# Patient Record
Sex: Male | Born: 1950 | Hispanic: Yes | Smoking: Former smoker
Health system: Southern US, Community
[De-identification: ages and names within clinical notes are randomized; demographics above are authoritative.]

## PROBLEM LIST (undated history)

## (undated) DIAGNOSIS — R519 Headache, unspecified: Secondary | ICD-10-CM

## (undated) DIAGNOSIS — I10 Essential (primary) hypertension: Secondary | ICD-10-CM

## (undated) DIAGNOSIS — F329 Major depressive disorder, single episode, unspecified: Secondary | ICD-10-CM

## (undated) DIAGNOSIS — F32A Depression, unspecified: Secondary | ICD-10-CM

## (undated) DIAGNOSIS — I2510 Atherosclerotic heart disease of native coronary artery without angina pectoris: Secondary | ICD-10-CM

## (undated) DIAGNOSIS — F419 Anxiety disorder, unspecified: Secondary | ICD-10-CM

## (undated) DIAGNOSIS — R0600 Dyspnea, unspecified: Secondary | ICD-10-CM

## (undated) DIAGNOSIS — R51 Headache: Secondary | ICD-10-CM

## (undated) DIAGNOSIS — E119 Type 2 diabetes mellitus without complications: Secondary | ICD-10-CM

## (undated) DIAGNOSIS — K219 Gastro-esophageal reflux disease without esophagitis: Secondary | ICD-10-CM

## (undated) DIAGNOSIS — M1990 Unspecified osteoarthritis, unspecified site: Secondary | ICD-10-CM

## (undated) DIAGNOSIS — J189 Pneumonia, unspecified organism: Secondary | ICD-10-CM

## (undated) DIAGNOSIS — I209 Angina pectoris, unspecified: Secondary | ICD-10-CM

## (undated) HISTORY — PX: CARDIAC CATHETERIZATION: SHX172

---

## 2015-07-28 DEATH — deceased

## 2018-04-11 DIAGNOSIS — R519 Headache, unspecified: Secondary | ICD-10-CM | POA: Insufficient documentation

## 2018-04-11 DIAGNOSIS — R42 Dizziness and giddiness: Secondary | ICD-10-CM | POA: Insufficient documentation

## 2018-04-11 DIAGNOSIS — R2681 Unsteadiness on feet: Secondary | ICD-10-CM | POA: Insufficient documentation

## 2018-04-18 DIAGNOSIS — I10 Essential (primary) hypertension: Secondary | ICD-10-CM | POA: Insufficient documentation

## 2018-11-05 ENCOUNTER — Ambulatory Visit
Admission: RE | Admit: 2018-11-05 | Discharge: 2018-11-05 | Disposition: A | Discharge status: Home / Self Care | Payer: Medicare Other | Source: Ambulatory Visit | Attending: Internal Medicine | Admitting: Internal Medicine

## 2018-11-05 ENCOUNTER — Other Ambulatory Visit: Payer: Self-pay

## 2018-11-05 ENCOUNTER — Encounter: Payer: Self-pay | Admitting: *Deleted

## 2018-11-05 ENCOUNTER — Encounter
Admission: RE | Disposition: A | Discharge status: Home / Self Care | Payer: Self-pay | Source: Ambulatory Visit | Attending: Internal Medicine

## 2018-11-05 DIAGNOSIS — I10 Essential (primary) hypertension: Secondary | ICD-10-CM | POA: Insufficient documentation

## 2018-11-05 DIAGNOSIS — Z87891 Personal history of nicotine dependence: Secondary | ICD-10-CM | POA: Diagnosis not present

## 2018-11-05 DIAGNOSIS — I200 Unstable angina: Secondary | ICD-10-CM

## 2018-11-05 DIAGNOSIS — E119 Type 2 diabetes mellitus without complications: Secondary | ICD-10-CM | POA: Diagnosis not present

## 2018-11-05 DIAGNOSIS — Z7982 Long term (current) use of aspirin: Secondary | ICD-10-CM | POA: Diagnosis not present

## 2018-11-05 DIAGNOSIS — K219 Gastro-esophageal reflux disease without esophagitis: Secondary | ICD-10-CM | POA: Diagnosis not present

## 2018-11-05 DIAGNOSIS — Z7984 Long term (current) use of oral hypoglycemic drugs: Secondary | ICD-10-CM | POA: Diagnosis not present

## 2018-11-05 DIAGNOSIS — Z6841 Body Mass Index (BMI) 40.0 and over, adult: Secondary | ICD-10-CM | POA: Insufficient documentation

## 2018-11-05 DIAGNOSIS — E782 Mixed hyperlipidemia: Secondary | ICD-10-CM | POA: Insufficient documentation

## 2018-11-05 HISTORY — DX: Essential (primary) hypertension: I10

## 2018-11-05 HISTORY — DX: Pneumonia, unspecified organism: J18.9

## 2018-11-05 HISTORY — DX: Gastro-esophageal reflux disease without esophagitis: K21.9

## 2018-11-05 HISTORY — DX: Unspecified osteoarthritis, unspecified site: M19.90

## 2018-11-05 HISTORY — DX: Major depressive disorder, single episode, unspecified: F32.9

## 2018-11-05 HISTORY — PX: LEFT HEART CATH AND CORONARY ANGIOGRAPHY: CATH118249

## 2018-11-05 HISTORY — DX: Atherosclerotic heart disease of native coronary artery without angina pectoris: I25.10

## 2018-11-05 HISTORY — DX: Headache: R51

## 2018-11-05 HISTORY — DX: Headache, unspecified: R51.9

## 2018-11-05 HISTORY — DX: Anxiety disorder, unspecified: F41.9

## 2018-11-05 HISTORY — DX: Angina pectoris, unspecified: I20.9

## 2018-11-05 HISTORY — DX: Type 2 diabetes mellitus without complications: E11.9

## 2018-11-05 HISTORY — DX: Depression, unspecified: F32.A

## 2018-11-05 LAB — GLUCOSE, CAPILLARY
GLUCOSE-CAPILLARY: 101 mg/dL — AB (ref 70–99)
Glucose-Capillary: 121 mg/dL — ABNORMAL HIGH (ref 70–99)

## 2018-11-05 SURGERY — Surgical Case
Anesthesia: *Unknown

## 2018-11-05 SURGERY — LEFT HEART CATH AND CORONARY ANGIOGRAPHY
Anesthesia: Moderate Sedation

## 2018-11-05 MED ORDER — SODIUM CHLORIDE 0.9% FLUSH: 3.0000 mL | Freq: Two times a day (BID) | INTRAVENOUS | Status: DC

## 2018-11-05 MED ORDER — ONDANSETRON HCL 4 MG/2ML IJ SOLN: 4.0000 mg | Freq: Four times a day (QID) | INTRAMUSCULAR | Status: DC | PRN

## 2018-11-05 MED ORDER — ASPIRIN 81 MG PO CHEW: 81.0000 mg | CHEWABLE_TABLET | ORAL | Status: DC

## 2018-11-05 MED ORDER — HEPARIN SODIUM (PORCINE) 1000 UNIT/ML IJ SOLN
INTRAMUSCULAR | Status: AC
  Filled 2018-11-05: qty 1

## 2018-11-05 MED ORDER — SODIUM CHLORIDE 0.9 % IV SOLN: 250.0000 mL | INTRAVENOUS | Status: DC | PRN

## 2018-11-05 MED ORDER — ACETAMINOPHEN 325 MG PO TABS: 650.0000 mg | ORAL_TABLET | ORAL | Status: DC | PRN

## 2018-11-05 MED ORDER — HEPARIN SODIUM (PORCINE) 1000 UNIT/ML IJ SOLN
INTRAMUSCULAR | Status: DC | PRN
  Administered 2018-11-05: 5500 [IU] via INTRAVENOUS

## 2018-11-05 MED ORDER — VERAPAMIL HCL 2.5 MG/ML IV SOLN
INTRAVENOUS | Status: AC
  Filled 2018-11-05: qty 2

## 2018-11-05 MED ORDER — SODIUM CHLORIDE 0.9% FLUSH: 3.0000 mL | INTRAVENOUS | Status: DC | PRN

## 2018-11-05 MED ORDER — IOPAMIDOL (ISOVUE-300) INJECTION 61%
INTRAVENOUS | Status: DC | PRN
  Administered 2018-11-05: 65 mL via INTRA_ARTERIAL

## 2018-11-05 MED ORDER — MIDAZOLAM HCL 2 MG/2ML IJ SOLN
INTRAMUSCULAR | Status: DC | PRN
  Administered 2018-11-05: 1 mg via INTRAVENOUS

## 2018-11-05 MED ORDER — FENTANYL CITRATE (PF) 100 MCG/2ML IJ SOLN
INTRAMUSCULAR | Status: DC | PRN
  Administered 2018-11-05: 50 ug via INTRAVENOUS

## 2018-11-05 MED ORDER — MIDAZOLAM HCL 2 MG/2ML IJ SOLN
INTRAMUSCULAR | Status: AC
  Filled 2018-11-05: qty 2

## 2018-11-05 MED ORDER — HEPARIN (PORCINE) IN NACL 1000-0.9 UT/500ML-% IV SOLN
INTRAVENOUS | Status: AC
  Filled 2018-11-05: qty 1000

## 2018-11-05 MED ORDER — SODIUM CHLORIDE 0.9 % WEIGHT BASED INFUSION: 1.0000 mL/kg/h | INTRAVENOUS | Status: DC

## 2018-11-05 MED ORDER — VERAPAMIL HCL 2.5 MG/ML IV SOLN
INTRAVENOUS | Status: DC | PRN
  Administered 2018-11-05: 2.5 mg via INTRA_ARTERIAL

## 2018-11-05 MED ORDER — FENTANYL CITRATE (PF) 100 MCG/2ML IJ SOLN
INTRAMUSCULAR | Status: AC
  Filled 2018-11-05: qty 2

## 2018-11-05 MED ORDER — SODIUM CHLORIDE 0.9 % WEIGHT BASED INFUSION
3.0000 mL/kg/h | INTRAVENOUS | Status: AC
  Administered 2018-11-05: 3 mL/kg/h via INTRAVENOUS

## 2018-11-05 SURGICAL SUPPLY — 7 items
CATH INFINITI 5 FR JL3.5 (CATHETERS) ×2 IMPLANT
CATH INFINITI JR4 5F (CATHETERS) ×2 IMPLANT
DEVICE RAD TR BAND REGULAR (VASCULAR PRODUCTS) ×2 IMPLANT
GLIDESHEATH SLEND A-KIT 6F 22G (SHEATH) ×2 IMPLANT
KIT MANI 3VAL PERCEP (MISCELLANEOUS) ×2 IMPLANT
PACK CARDIAC CATH (CUSTOM PROCEDURE TRAY) ×2 IMPLANT
WIRE ROSEN-J .035X260CM (WIRE) ×2 IMPLANT

## 2018-11-05 NOTE — Discharge Instructions (Signed)
Sedacin consciente [employer] adultos, cuidados posteriores (Moderate Conscious Sedation, Adult, Care After) Estas indicaciones le proporcionan informacin acerca de cmo deber cuidarse despus del procedimiento. El mdico tambin podr darle instrucciones ms especficas. El tratamiento ha sido planificado segn las prcticas mdicas actuales, pero en algunos casos pueden ocurrir problemas. Comunquese con el mdico si tiene algn problema o dudas despus del procedimiento. QU ESPERAR DESPUS DEL PROCEDIMIENTO Despus del procedimiento, es comn:  Sentirse somnoliento durante varias horas.  Sentirse torpe y [employer] de equilibrio durante varias horas.  Perder el sentido de la realidad durante varias horas.  Vomitar si come [employer]. INSTRUCCIONES PARA EL CUIDADO EN EL HOGAR Durante al menos 24horas despus del procedimiento:  No haga lo siguiente: ? Participar en actividades que impliquen posibles cadas o lesiones. ? Conducir vehculos. ? Operar maquinarias pesadas. ? Beber alcohol. ? Tomar somnferos o medicamentos que causen somnolencia. ? Firmar documentos legales ni tomar [employer]. ? Cuidar a nios por su cuenta.  Hacer reposo. Comida y bebida  Siga la dieta recomendada por el mdico.  Si vomita: ? Pruebe agua, jugo o sopa cuando usted pueda beber sin vomitar. ? Asegrese de no tener nuseas antes de ingerir alimentos slidos. Instrucciones generales  Permanezca con un adulto responsable hasta que est completamente despierto y consciente.  Tome los medicamentos de venta libre y los recetados solamente como se lo haya indicado el mdico.  Si fuma, no lo haga sin supervisin.  Concurra a todas las visitas de control como se lo haya indicado el mdico. Esto es importante. SOLICITE ATENCIN MDICA SI:  Sigue teniendo nuseas o vomitando.  Tiene sensacin de desvanecimiento.  Le aparece una erupcin cutnea.  Tiene fiebre. SOLICITE  ATENCIN MDICA DE INMEDIATO SI:  Tiene dificultad para respirar. Esta informacin no tiene Marine scientist el consejo del mdico. Asegrese de hacerle al mdico cualquier pregunta que tenga. Document Released: 11/17/2013 Document Revised: 12/03/2014 Document Reviewed: 03/03/2016 Elsevier Interactive Patient Education  2018 [place] sitio del radio (Radial Site Care) Siga estas instrucciones durante las prximas semanas. Estas indicaciones le proporcionan informacin acerca de cmo deber cuidarse despus del procedimiento. El mdico tambin podr darle instrucciones ms especficas. El tratamiento ha sido planificado segn las prcticas mdicas actuales, pero en algunos casos pueden ocurrir problemas. Comunquese con el mdico si tiene algn problema o tiene dudas despus del procedimiento. QU ESPERAR DESPUS DEL PROCEDIMIENTO Despus del procedimiento, es normal tener lo siguiente:  Hematomas en el sitio del radio que suelen desaparecer en el trmino de 1 o 2semanas.  Acumulacin de sangre en el tejido (hematoma) que puede ser dolorosa al tacto. Generalmente, en 1 o 2 semanas deben disminuir su tamao y el dolor que produce con la palpacin. Gerster los medicamentos solamente como se lo haya indicado el mdico.  Puede ducharse 24a 48horas despus del procedimiento o como se lo haya indicado el mdico. Retire el vendaje (apsito) y limpie suavemente el lugar de la insercin con agua y jabn comn. Seque bien el rea con una toalla limpia dando golpecitos. No frote el lugar, ya que Therapist, art.  No tome baos de inmersin, no nade ni use el jacuzzi hasta que el mdico lo autorice.  Revise diariamente el lugar de la insercin para ver si aparece enrojecimiento, hinchazn o secrecin.  No se aplique talcos ni lociones en el lugar.  No flexione ni doble el brazo afectado durante 24horas o como se lo haya  indicado el  mdico.  No haga esfuerzos ni levante objetos pesados con el brazo afectado durante 24horas o como se lo haya indicado el mdico.  No levante objetos que pesen ms de 10libras (4,5kg) durante los 5das posteriores al procedimiento o como se lo haya indicado el mdico.  Pregntele al mdico cundo puede hacer lo siguiente: ? Regresar a la escuela o al [employer]. ? Reanudar las actividades fsicas o los deportes que practica habitualmente. ? Reanudar la actividad sexual.  No conduzca el automvil por sus propios medios si le dan el alta el mismo da del procedimiento. Pdale a otra persona que lo lleve.  Puede conducir 24horas despus del procedimiento, a menos que el mdico le haya indicado lo contrario.  No opere maquinaria ni herramientas elctricas durante 24horas despus del procedimiento.  Si el procedimiento se hizo de [employer], lo que significa que regres a su casa el mismo da de su realizacin, un adulto responsable debe acompaarlo durante las primeras 24horas.  Concurra a todas las visitas de control como se lo haya indicado el mdico. Esto es importante. SOLICITE ATENCIN MDICA SI:  Jaclynn Guarneri.  Tiene escalofros.  Aumenta el sangrado en el sitio del radio. Haga presin Mudlogger. SOLICITE ATENCIN MDICA DE INMEDIATO SI:  Tiene un dolor que no es habitual en el sitio del radio.  Nota que el sitio del radio est enrojecido, caliente o hinchado.  Tiene secrecin (que no es una pequea cantidad de sangre en el vendaje) en el sitio del radio.  El sitio del radio est sangrando, y el sangrado no se detiene despus de 25minutos de Oceanographer una presin constante.  El brazo o la mano se le ponen plidos, fros o siente hormigueo o adormecimiento. Esta informacin no tiene Marine scientist el consejo del mdico. Asegrese de hacerle al mdico cualquier pregunta que tenga. Document Released: 03/09/2011 Document Revised: 12/03/2014 Document Reviewed:  05/31/2014 Elsevier Interactive Patient Education  [employer].

## 2018-11-05 NOTE — OR Nursing (Signed)
Dr Clayborn Bigness spoke to son in law over phone about findings and shared info to have pt stay off Metformin until friday

## 2018-11-06 ENCOUNTER — Encounter: Payer: Self-pay | Admitting: Internal Medicine

## 2020-02-10 DIAGNOSIS — U071 COVID-19: Secondary | ICD-10-CM | POA: Insufficient documentation

## 2020-02-10 DIAGNOSIS — E119 Type 2 diabetes mellitus without complications: Secondary | ICD-10-CM | POA: Insufficient documentation

## 2020-08-30 ENCOUNTER — Encounter: Payer: Self-pay | Admitting: Family Medicine

## 2020-09-03 ENCOUNTER — Other Ambulatory Visit: Payer: Self-pay

## 2020-09-03 ENCOUNTER — Emergency Department: Payer: Medicare Other

## 2020-09-03 ENCOUNTER — Emergency Department
Admission: EM | Admit: 2020-09-03 | Discharge: 2020-09-03 | Disposition: A | Discharge status: Home / Self Care | Payer: Medicare Other | Attending: Emergency Medicine | Admitting: Emergency Medicine

## 2020-09-03 ENCOUNTER — Encounter: Payer: Self-pay | Admitting: Emergency Medicine

## 2020-09-03 DIAGNOSIS — R079 Chest pain, unspecified: Secondary | ICD-10-CM

## 2020-09-03 DIAGNOSIS — D649 Anemia, unspecified: Secondary | ICD-10-CM | POA: Diagnosis not present

## 2020-09-03 DIAGNOSIS — I10 Essential (primary) hypertension: Secondary | ICD-10-CM | POA: Insufficient documentation

## 2020-09-03 DIAGNOSIS — E119 Type 2 diabetes mellitus without complications: Secondary | ICD-10-CM | POA: Insufficient documentation

## 2020-09-03 DIAGNOSIS — K219 Gastro-esophageal reflux disease without esophagitis: Secondary | ICD-10-CM | POA: Insufficient documentation

## 2020-09-03 DIAGNOSIS — I25119 Atherosclerotic heart disease of native coronary artery with unspecified angina pectoris: Secondary | ICD-10-CM | POA: Diagnosis not present

## 2020-09-03 DIAGNOSIS — Z7984 Long term (current) use of oral hypoglycemic drugs: Secondary | ICD-10-CM | POA: Insufficient documentation

## 2020-09-03 DIAGNOSIS — Z79899 Other long term (current) drug therapy: Secondary | ICD-10-CM | POA: Diagnosis not present

## 2020-09-03 DIAGNOSIS — Z7982 Long term (current) use of aspirin: Secondary | ICD-10-CM | POA: Diagnosis not present

## 2020-09-03 DIAGNOSIS — Z87891 Personal history of nicotine dependence: Secondary | ICD-10-CM | POA: Insufficient documentation

## 2020-09-03 DIAGNOSIS — R1013 Epigastric pain: Secondary | ICD-10-CM

## 2020-09-03 LAB — CBC
HCT: 26.1 % — ABNORMAL LOW (ref 39.0–52.0)
Hemoglobin: 8.5 g/dL — ABNORMAL LOW (ref 13.0–17.0)
MCH: 26.9 pg (ref 26.0–34.0)
MCHC: 32.6 g/dL (ref 30.0–36.0)
MCV: 82.6 fL (ref 80.0–100.0)
Platelets: 341 10*3/uL (ref 150–400)
RBC: 3.16 MIL/uL — ABNORMAL LOW (ref 4.22–5.81)
RDW: 15.3 % (ref 11.5–15.5)
WBC: 5.7 10*3/uL (ref 4.0–10.5)
nRBC: 0 % (ref 0.0–0.2)

## 2020-09-03 LAB — COMPREHENSIVE METABOLIC PANEL
ALT: 16 U/L (ref 0–44)
AST: 19 U/L (ref 15–41)
Albumin: 4 g/dL (ref 3.5–5.0)
Alkaline Phosphatase: 71 U/L (ref 38–126)
Anion gap: 9 (ref 5–15)
BUN: 17 mg/dL (ref 8–23)
CO2: 22 mmol/L (ref 22–32)
Calcium: 8.9 mg/dL (ref 8.9–10.3)
Chloride: 102 mmol/L (ref 98–111)
Creatinine, Ser: 1.03 mg/dL (ref 0.61–1.24)
GFR, Estimated: >60 mL/min (ref >60)
Glucose, Bld: 103 mg/dL — ABNORMAL HIGH (ref 70–99)
Potassium: 4.1 mmol/L (ref 3.5–5.1)
Sodium: 133 mmol/L — ABNORMAL LOW (ref 135–145)
Total Bilirubin: 0.5 mg/dL (ref 0.3–1.2)
Total Protein: 7.5 g/dL (ref 6.5–8.1)

## 2020-09-03 LAB — URINALYSIS, COMPLETE (UACMP) WITH MICROSCOPIC
Bacteria, UA: NONE SEEN
Bilirubin Urine: NEGATIVE
Glucose, UA: NEGATIVE mg/dL
Hgb urine dipstick: NEGATIVE
Ketones, ur: NEGATIVE mg/dL
Leukocytes,Ua: NEGATIVE
Nitrite: NEGATIVE
Protein, ur: NEGATIVE mg/dL
Specific Gravity, Urine: 1.005 (ref 1.005–1.030)
pH: 7 (ref 5.0–8.0)

## 2020-09-03 LAB — LIPASE, BLOOD: Lipase: 34 U/L (ref 11–51)

## 2020-09-03 LAB — TROPONIN I (HIGH SENSITIVITY): Troponin I (High Sensitivity): <2 ng/L (ref <18)

## 2020-09-03 MED ORDER — PANTOPRAZOLE SODIUM 20 MG PO TBEC: 20.0000 mg | DELAYED_RELEASE_TABLET | Freq: Every day | ORAL | 1 refills | Status: DC

## 2020-09-03 NOTE — ED Triage Notes (Signed)
Pt to ED via POV c/o epigastric abdominal pain. Pt states that he had similar episode in September, pt drink a beer to make himself vomit and he had episode of vomiting blood. Pt has not vomited blood since then but states that he has continued to have abdominal pain and the pain is getting worse. Pt is currently in NAD.

## 2020-09-03 NOTE — ED Provider Notes (Signed)
[facility] Emergency Department Provider Note   ____________________________________________    I have reviewed the triage vital signs and the nursing notes.   HISTORY  Chief Complaint Abdominal Pain   Spanish interpreter used.  HPI Jack Fisher is a 68 y.o. male who presents with multiple complaints.  Patient describes intermittent abdominal discomfort, intermittent chest discomfort, sometimes he has tingling in his legs and also is concerned because he apparently threw up 1 month ago and had blood in it however no further episodes.  He is chest pain-free today, he does report some mild epigastric discomfort described as burning.  Reports normal stools, brown and not tarry or black.  No fevers chills or nausea vomiting today.  No neuro deficits.  Review of records demonstrates the patient had a catheterization in the past which was overall reassuring  Past Medical History:  Diagnosis Date  . Anginal pain ([place])   . Anxiety   . Arthritis    knees, elbows  . Coronary artery disease   . Depression   . Diabetes mellitus without complication ([place])   . GERD (gastroesophageal reflux disease)   . Headache   . Hypertension   . Pneumonia     There are no problems to display for this patient.   Past Surgical History:  Procedure Laterality Date  . LEFT HEART CATH AND CORONARY ANGIOGRAPHY N/A 11/05/2018   Procedure: LEFT HEART CATH AND CORONARY ANGIOGRAPHY;  Surgeon: Yolonda Kida, MD;  Location: [place] [facility];  Service: Cardiovascular;  Laterality: N/A;    Prior to Admission medications   Medication Sig Start Date End Date Taking? Authorizing Provider  aspirin EC 81 MG tablet Take 81 mg by mouth daily.    [provider]  enalapril (VASOTEC) 10 MG tablet Take 10 mg by mouth daily.    [provider]  hydrochlorothiazide (HYDRODIURIL) 25 MG tablet Take 25 mg by mouth daily.    [provider]    LORazepam (ATIVAN) 1 MG tablet Take 1 mg by mouth every 8 (eight) hours.    [provider]  metFORMIN (GLUCOPHAGE) 500 MG tablet Take 500 mg by mouth 2 (two) times daily with a meal.    [provider]  pantoprazole (PROTONIX) 20 MG tablet Take 1 tablet (20 mg total) by mouth daily. 09/03/20 09/03/21  Lavonia Drafts, MD     Allergies Penicillins  No family history on file.  Social History Social History   Tobacco Use  . Smoking status: Former Research scientist (life sciences)  . Smokeless tobacco: Never Used  Vaping Use  . Vaping Use: Never used  Substance Use Topics  . Alcohol use: Not Currently    Comment: quit one month ago  . Drug use: Not on file    Review of Systems  Constitutional: No fever/chills Eyes: No visual changes.  ENT: No sore throat. Cardiovascular: As above Respiratory: Mild shortness of breath with exertion Gastrointestinal: As above Genitourinary: Negative for dysuria. Musculoskeletal: Negative for back pain. Skin: Negative for rash. Neurological: As above   ____________________________________________   PHYSICAL EXAM:  VITAL SIGNS: ED Triage Vitals  Enc Vitals Group     BP 09/03/20 0749 (!) 155/63     Pulse Rate 09/03/20 0749 83     Resp 09/03/20 0749 16     Temp 09/03/20 0751 97.7 F (36.5 C)     Temp Source 09/03/20 0749 Oral     SpO2 09/03/20 0749 99 %     Weight 09/03/20 0754  111.1 kg (245 lb)     Height 09/03/20 0754 1.676 m (5\' 6" )     Head Circumference --      Peak Flow --      Pain Score 09/03/20 0753 5     Pain Loc --      Pain Edu? --      Excl. in [place]? --     Constitutional: Alert and oriented.   Nose: No congestion/rhinnorhea. Mouth/Throat: Mucous membranes are moist.   Neck:  Painless ROM Cardiovascular: Normal rate, regular rhythm. Grossly normal heart sounds.  Good peripheral circulation. Respiratory: Normal respiratory effort.  No retractions. Lungs CTAB. Gastrointestinal: Soft and nontender. No distention.  No CVA  tenderness.  Rectal exam, brown stool guaiac negative Genitourinary: deferred Musculoskeletal: No lower extremity tenderness nor edema.  Warm and well perfused.  Normal strength in the lower extremities, ambulates well and without difficulty Neurologic:  Normal speech and language. No gross focal neurologic deficits are appreciated.  Skin:  Skin is warm, dry and intact. No rash noted. Psychiatric: Mood and affect are normal. Speech and behavior are normal.  ____________________________________________   LABS (all labs ordered are listed, but only abnormal results are displayed)  Labs Reviewed  COMPREHENSIVE METABOLIC PANEL - Abnormal; Notable for the following components:      Result Value   Sodium 133 (*)    Glucose, Bld 103 (*)    All other components within normal limits  CBC - Abnormal; Notable for the following components:   RBC 3.16 (*)    Hemoglobin 8.5 (*)    HCT 26.1 (*)    All other components within normal limits  URINALYSIS, COMPLETE (UACMP) WITH MICROSCOPIC - Abnormal; Notable for the following components:   Color, Urine STRAW (*)    APPearance CLEAR (*)    All other components within normal limits  LIPASE, BLOOD  TROPONIN I (HIGH SENSITIVITY)   ____________________________________________  EKG  ED ECG REPORT I, Lavonia Drafts, the attending physician, personally viewed and interpreted this ECG.  Date: 09/03/2020  Rhythm: normal sinus rhythm QRS Axis: normal Intervals: normal ST/T Wave abnormalities: normal Narrative Interpretation: no evidence of acute ischemia  ____________________________________________  RADIOLOGY  Chest x-ray viewed by me, no infiltrate effusion or pneumothorax ____________________________________________   PROCEDURES  Procedure(s) performed: No  Procedures   Critical Care performed: No ____________________________________________   INITIAL IMPRESSION / ASSESSMENT AND PLAN / ED COURSE  Pertinent labs & imaging results  that were available during my care of the patient were reviewed by me and considered in my medical decision making (see chart for details).  Patient presents with multiple complaints as described above.  Seems most concerned about epigastric discomfort that he has had intermittently as well as an episode where he had vomiting with some blood in it.  This is not happened in over a month.  Stools are normal.  Guaiac negative on exam  Does have a mild decrease in his hemoglobin of unclear etiology.  His troponin is normal, his EKG is reassuring, chest x-ray is benign.  Normal rectal exam guaiac negative.  I will have him follow-up closely with GI for further evaluation, will start him on PPI.  He understands need to follow-up with GI, he can return anytime if any worsening of his discomfort.    ____________________________________________   FINAL CLINICAL IMPRESSION(S) / ED DIAGNOSES  Final diagnoses:  Chest pain, unspecified type  Epigastric pain  Anemia, unspecified type        Note:  This  document was prepared using Systems analyst and may include unintentional dictation errors.   Lavonia Drafts, MD 09/03/20 1428

## 2020-11-14 ENCOUNTER — Other Ambulatory Visit: Payer: Self-pay

## 2020-11-16 ENCOUNTER — Ambulatory Visit: Payer: Medicare Other | Admitting: Gastroenterology

## 2020-11-18 IMAGING — CR DG CHEST 2V
2 series · 2 of 2 positions shown · non-contrast
Comparison: None.

CLINICAL DATA: Epigastric abdominal pain.

EXAM:
CHEST - 2 VIEW

[chest pa]
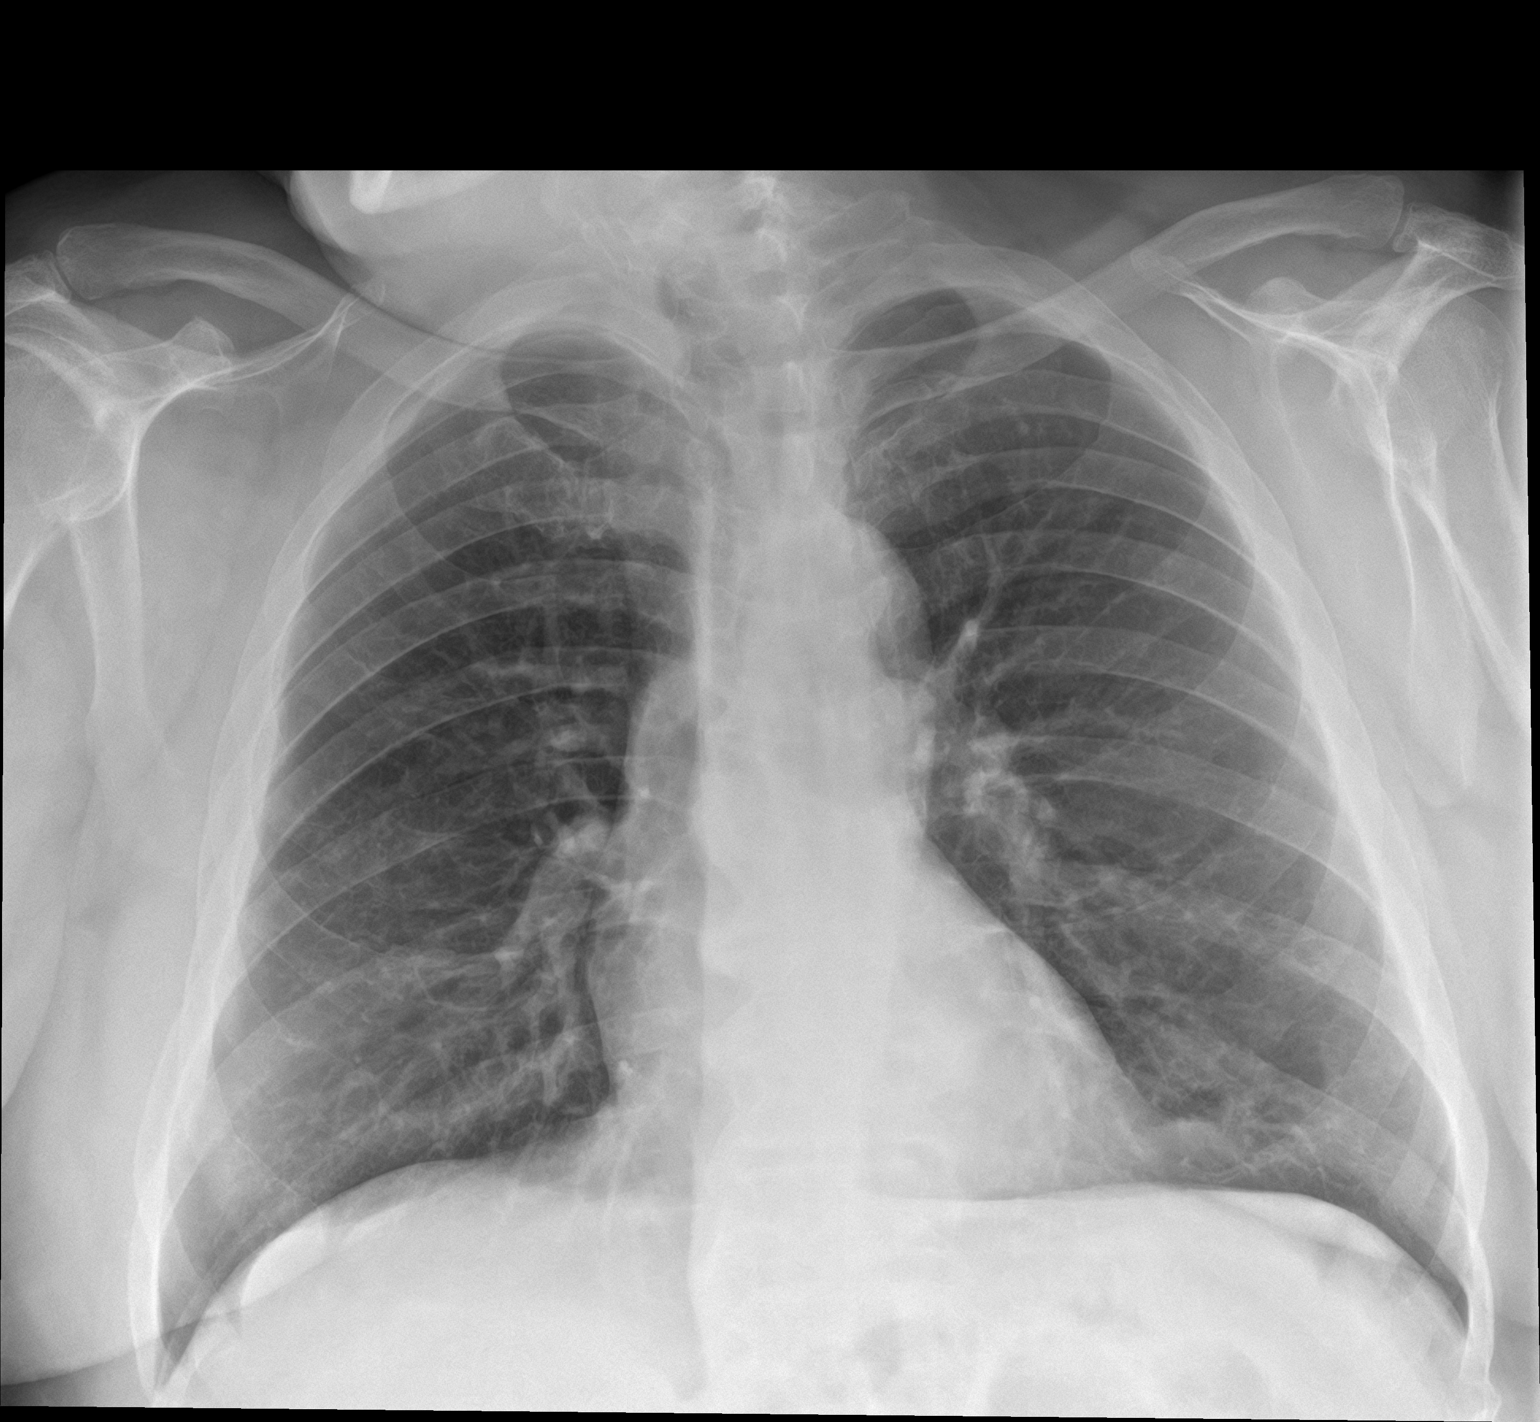

[chest lat]
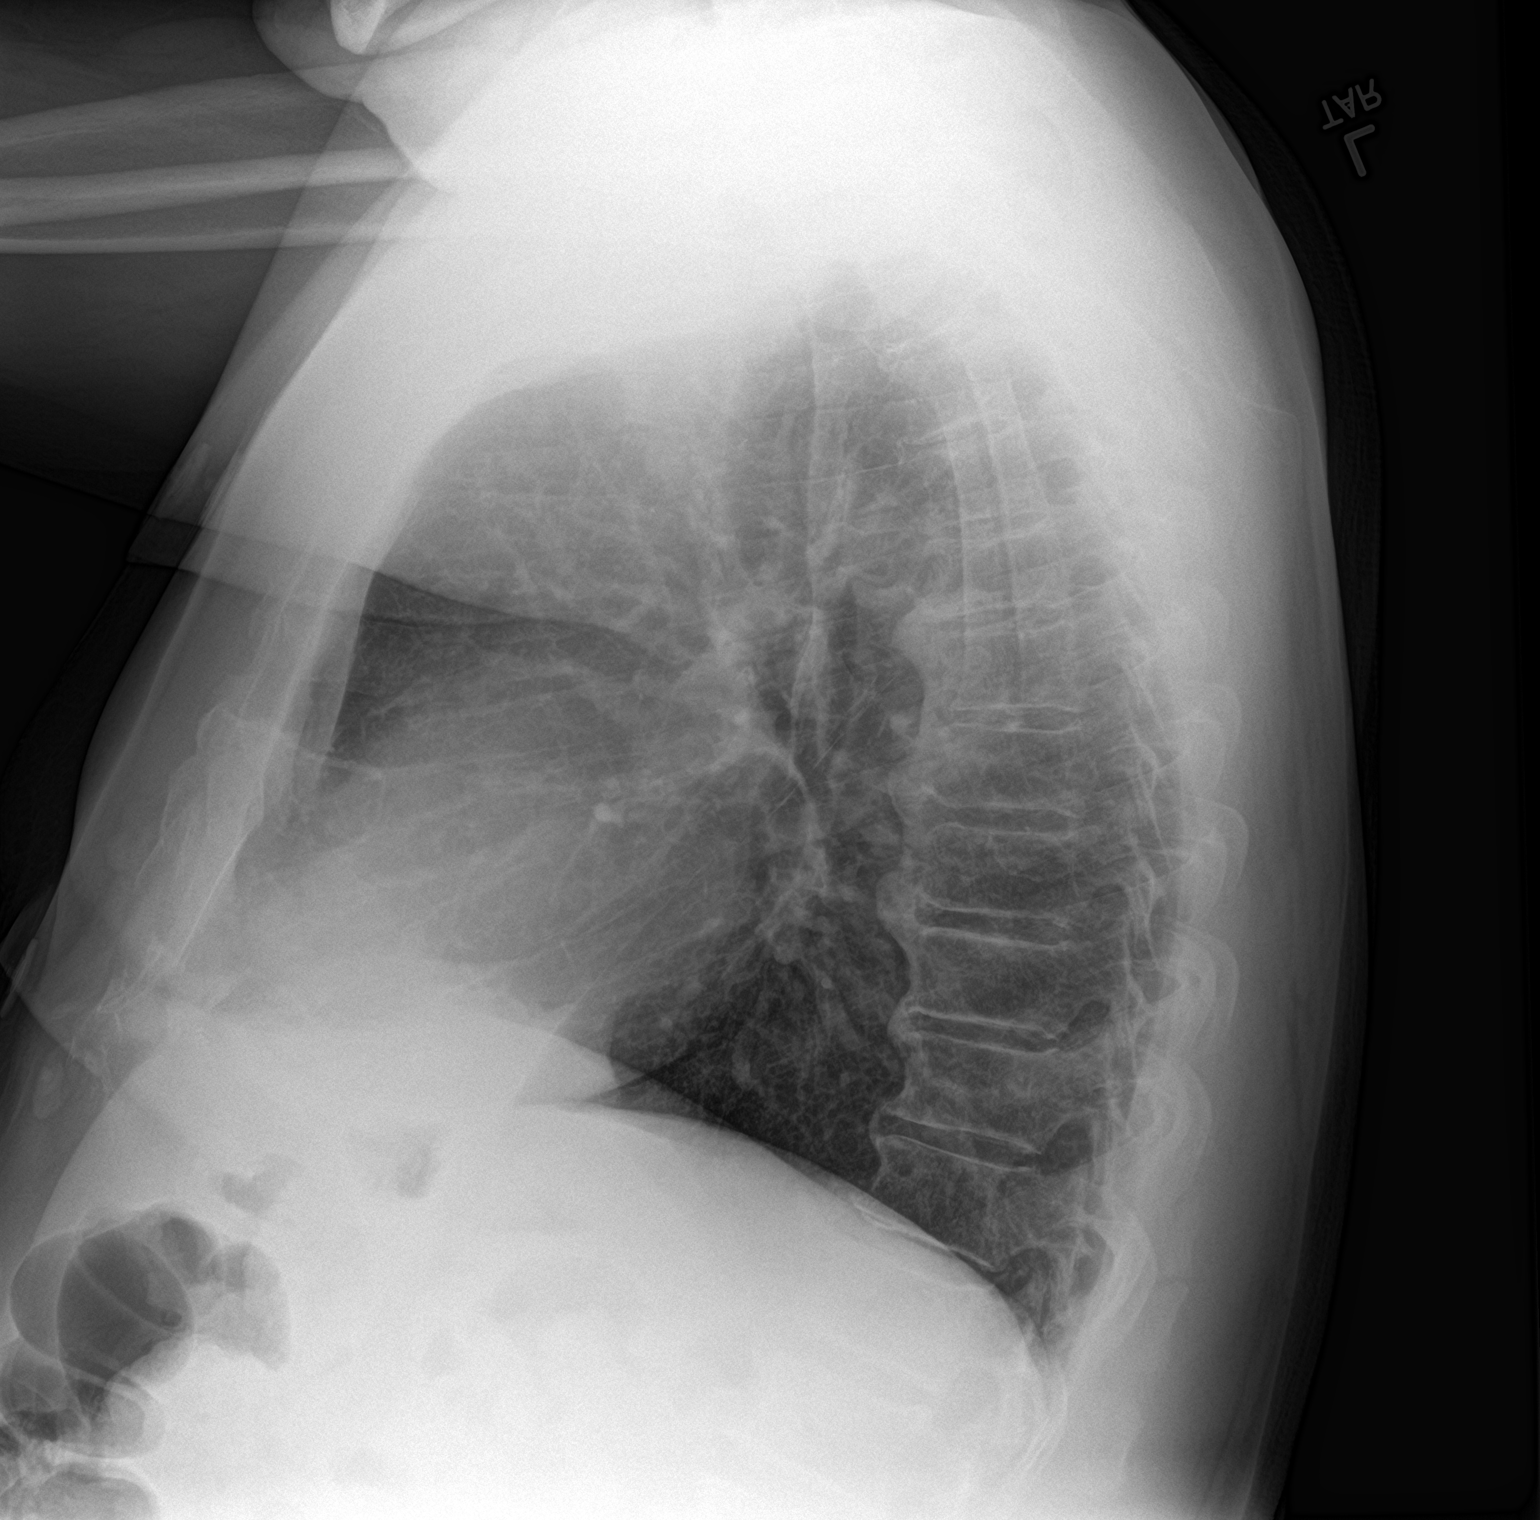

[2 of 2 positions shown; findings below may reference images not displayed]

FINDINGS: The heart size and mediastinal contours are within normal limits.
Both lungs are clear. The visualized skeletal structures are
unremarkable.
IMPRESSION: No active cardiopulmonary disease.

## 2021-01-02 ENCOUNTER — Other Ambulatory Visit: Payer: Self-pay

## 2021-01-02 ENCOUNTER — Telehealth: Payer: Self-pay

## 2021-01-02 ENCOUNTER — Encounter: Payer: Self-pay | Admitting: Gastroenterology

## 2021-01-02 ENCOUNTER — Ambulatory Visit (INDEPENDENT_AMBULATORY_CARE_PROVIDER_SITE_OTHER): Payer: Medicare Other | Admitting: Gastroenterology

## 2021-01-02 VITALS — BP 145/75 | HR 71 | Temp 98.0°F | Ht 68.0 in | Wt 248.4 lb

## 2021-01-02 DIAGNOSIS — R1013 Epigastric pain: Secondary | ICD-10-CM

## 2021-01-02 DIAGNOSIS — K5900 Constipation, unspecified: Secondary | ICD-10-CM

## 2021-01-02 DIAGNOSIS — E538 Deficiency of other specified B group vitamins: Secondary | ICD-10-CM | POA: Diagnosis not present

## 2021-01-02 DIAGNOSIS — Z1211 Encounter for screening for malignant neoplasm of colon: Secondary | ICD-10-CM

## 2021-01-02 MED ORDER — MAGNESIUM CITRATE PO SOLN: 1.0000 | Freq: Once | ORAL | 0 refills | Status: AC

## 2021-01-02 MED ORDER — NA SULFATE-K SULFATE-MG SULF 17.5-3.13-1.6 GM/177ML PO SOLN: 354.0000 mL | Freq: Once | ORAL | 0 refills | Status: AC

## 2021-01-02 NOTE — Patient Instructions (Addendum)
Please take Miralax use 1 cup full daily twice a day.   High-Fiber Eating Plan Fiber, also called dietary fiber, is a type of carbohydrate. It is found foods such as fruits, vegetables, whole grains, and beans. A high-fiber diet can have many health benefits. Your health care provider may recommend a high-fiber diet to help:  Prevent constipation. Fiber can make your bowel movements more regular.  Lower your cholesterol.  Relieve the following conditions: ? Inflammation of veins in the anus (hemorrhoids). ? Inflammation of specific areas of the digestive tract (uncomplicated diverticulosis). ? A problem of the large intestine, also called the colon, that sometimes causes pain and diarrhea (irritable bowel syndrome, or IBS).  Prevent overeating as part of a weight-loss plan.  Prevent heart disease, type 2 diabetes, and certain cancers. What are tips for following this plan? Reading food labels  Check the nutrition facts label on food products for the amount of dietary fiber. Choose foods that have 5 grams of fiber or more per serving.  The goals for recommended daily fiber intake include: ? Men (age 31 or younger): 34-38 g. ? Men (over age 41): 28-34 g. ? Women (age 32 or younger): 25-28 g. ? Women (over age 63): 22-25 g. Your daily fiber goal is _____________ g.   Shopping  Choose whole fruits and vegetables instead of processed forms, such as apple juice or applesauce.  Choose a wide variety of high-fiber foods such as avocados, lentils, oats, and kidney beans.  Read the nutrition facts label of the foods you choose. Be aware of foods with added fiber. These foods often have high sugar and sodium amounts per serving. Cooking  Use whole-grain flour for baking and cooking.  Cook with brown rice instead of white rice. Meal planning  Start the day with a breakfast that is high in fiber, such as a cereal that contains 5 g of fiber or more per serving.  Eat breads and cereals  that are made with whole-grain flour instead of refined flour or white flour.  Eat brown rice, bulgur wheat, or millet instead of white rice.  Use beans in place of meat in soups, salads, and pasta dishes.  Be sure that half of the grains you eat each day are whole grains. General information  You can get the recommended daily intake of dietary fiber by: ? Eating a variety of fruits, vegetables, grains, nuts, and beans. ? Taking a fiber supplement if you are not able to take in enough fiber in your diet. It is better to get fiber through food than from a supplement.  Gradually increase how much fiber you consume. If you increase your intake of dietary fiber too quickly, you may have bloating, cramping, or gas.  Drink plenty of water to help you digest fiber.  Choose high-fiber snacks, such as berries, raw vegetables, nuts, and popcorn. What foods should I eat? Fruits Berries. Pears. Apples. Oranges. Avocado. Prunes and raisins. Dried figs. Vegetables Sweet potatoes. Spinach. Kale. Artichokes. Cabbage. Broccoli. Cauliflower. Green peas. Carrots. Squash. Grains Whole-grain breads. Multigrain cereal. Oats and oatmeal. Brown rice. Barley. Bulgur wheat. [place]. Quinoa. Bran muffins. Popcorn. Rye wafer crackers. Meats and other proteins Navy beans, kidney beans, and pinto beans. Soybeans. Split peas. Lentils. Nuts and seeds. Dairy Fiber-fortified yogurt. Beverages Fiber-fortified soy milk. Fiber-fortified orange juice. Other foods Fiber bars. The items listed above may not be a complete list of recommended foods and beverages. Contact a dietitian for more information. What foods should I avoid? Fruits  Fruit juice. Cooked, strained fruit. Vegetables Fried potatoes. Canned vegetables. Well-cooked vegetables. Grains White bread. Pasta made with refined flour. White rice. Meats and other proteins Fatty cuts of meat. Fried chicken or fried fish. Dairy Milk. Yogurt. Cream cheese. Sour  cream. Fats and oils Butters. Beverages Soft drinks. Other foods Cakes and pastries. The items listed above may not be a complete list of foods and beverages to avoid. Talk with your dietitian about what choices are best for you. Summary  Fiber is a type of carbohydrate. It is found in foods such as fruits, vegetables, whole grains, and beans.  A high-fiber diet has many benefits. It can help to prevent constipation, lower blood cholesterol, aid weight loss, and reduce your risk of heart disease, diabetes, and certain cancers.  Increase your intake of fiber gradually. Increasing fiber too quickly may cause cramping, bloating, and gas. Drink plenty of water while you increase the amount of fiber you consume.  The best sources of fiber include whole fruits and vegetables, whole grains, nuts, seeds, and beans. This information is not intended to replace advice given to you by your health care provider. Make sure you discuss any questions you have with your health care provider. Document Revised: 03/17/2020 Document Reviewed: 03/17/2020 Elsevier Patient Education  2021 [employer].

## 2021-01-02 NOTE — Telephone Encounter (Signed)
Went in the room to go over instructions for colonoscopy and EGD the interpreter states he will take care of going over the instructions. He then walked out with the patient

## 2021-01-02 NOTE — Progress Notes (Signed)
Cephas Darby, MD [address]  [place]  [place], [place] [address]  Main: [phone]  Fax: [phone]    Gastroenterology Consultation  Referring Provider:     Theotis Burrow* Primary Care Physician:  Theotis Burrow, MD Primary Gastroenterologist:  Dr. Cephas Darby Reason for Consultation:     Epigastric pain, constipation        HPI:   Jack Fisher is a 70 y.o. male referred by Dr. Alene Mires, Elyse Jarvis, MD  for consultation & management of epigastric pain and constipation.  Patient reports onset September 2021, he has been experiencing severe epigastric pain and he reported an episode of hematemesis.  He went to ER on 09/03/2020 secondary to epigastric pain, was started on pantoprazole 20 mg daily.  However, his PCP increased it to 40 mg once a day.  He also reports hard bowel movements, irregular.  His labs revealed normocytic anemia in 10/21, hemoglobin 8.5, MCV 82.6, platelets 341, CMP was normal  He reports drinking alcohol occasionally, does not smoke He denies any NSAID use He was on iron, not taking it anymore  NSAIDs: None  Antiplts/Anticoagulants/Anti thrombotics: None  GI Procedures: None  Past Medical History:  Diagnosis Date  . Anginal pain ([place])   . Anxiety   . Arthritis    knees, elbows  . Coronary artery disease   . Depression   . Diabetes mellitus without complication ([place])   . GERD (gastroesophageal reflux disease)   . Headache   . Hypertension   . Pneumonia     Past Surgical History:  Procedure Laterality Date  . LEFT HEART CATH AND CORONARY ANGIOGRAPHY N/A 11/05/2018   Procedure: LEFT HEART CATH AND CORONARY ANGIOGRAPHY;  Surgeon: Yolonda Kida, MD;  Location: [place] [facility];  Service: Cardiovascular;  Laterality: N/A;    Current Outpatient Medications:  .  amLODipine (NORVASC) 5 MG tablet, Take by mouth., Disp: , Rfl:  .  FLOMAX 0.4 MG CAPS capsule, Take 0.4 mg by mouth at  bedtime., Disp: , Rfl:  .  lisinopril-hydrochlorothiazide (ZESTORETIC) 20-12.5 MG tablet, Take 1 tablet by mouth daily., Disp: , Rfl:  .  LORazepam (ATIVAN) 1 MG tablet, Take 1 mg by mouth every 8 (eight) hours., Disp: , Rfl:  .  magnesium citrate SOLN, Take 296 mLs (1 Bottle total) by mouth once for 1 dose., Disp: 195 mL, Rfl: 0 .  Na Sulfate-K Sulfate-Mg Sulf 17.5-3.13-1.6 GM/177ML SOLN, Take 354 mLs by mouth once for 1 dose., Disp: 354 mL, Rfl: 0 .  pantoprazole (PROTONIX) 40 MG tablet, Take 40 mg by mouth daily., Disp: , Rfl:  .  nortriptyline (PAMELOR) 25 MG capsule, Take by mouth. (Patient not taking: Reported on 01/02/2021), Disp: , Rfl:    History reviewed. No pertinent family history.   Social History   Tobacco Use  . Smoking status: Former Research scientist (life sciences)  . Smokeless tobacco: Never Used  Vaping Use  . Vaping Use: Never used  Substance Use Topics  . Alcohol use: Not Currently    Comment: quit one month ago  . Drug use: Never    Allergies as of 01/02/2021 - Review Complete 01/02/2021  Allergen Reaction Noted  . Penicillins  11/05/2018    Review of Systems:    All systems reviewed and negative except where noted in HPI.   Physical Exam:  BP (!) 145/75 (BP Location: Left Arm, Patient Position: Sitting, Cuff Size: Large)   Pulse 71   Temp 98 F (36.7 C) (  Oral)   Ht 5\' 8"  (1.727 m)   Wt 248 lb 6 oz (112.7 kg)   BMI 37.77 kg/m  No LMP for male patient.  General:   Alert,  Well-developed, well-nourished, pleasant and cooperative in NAD Head:  Normocephalic and atraumatic. Eyes:  Sclera clear, no icterus.   Conjunctiva pink. Ears:  Normal auditory acuity. Nose:  No deformity, discharge, or lesions. Mouth:  No deformity or lesions,oropharynx pink & moist. Neck:  Supple; no masses or thyromegaly. Lungs:  Respirations even and unlabored.  Clear throughout to auscultation.   No wheezes, crackles, or rhonchi. No acute distress. Heart:  Regular rate and rhythm; no murmurs, clicks,  rubs, or gallops. Abdomen:  Normal bowel sounds. Soft, obese, non-tender and non-distended without masses, hepatosplenomegaly or hernias noted.  No guarding or rebound tenderness.   Rectal: Not performed Msk:  Symmetrical without gross deformities. Good, equal movement & strength bilaterally. Pulses:  Normal pulses noted. Extremities:  No clubbing or edema.  No cyanosis. Neurologic:  Alert and oriented x3;  grossly normal neurologically. Skin:  Intact without significant lesions or rashes. No jaundice. Psych:  Alert and cooperative. Normal mood and affect.  Imaging Studies: None  Assessment and Plan:   Jack Fisher is a 70 y.o. male with metabolic syndrome is seen in consultation for chronic epigastric pain, past history of hematemesis, chronic constipation  Chronic epigastric pain with history of hematemesis and normocytic anemia Recheck CBC today Check iron panel, B12 and folate levels Increase Protonix to 40 mg twice daily Recommend EGD for further evaluation  Chronic constipation Discussed about high-fiber diet, information provided Start MiraLAX 2 times daily  Colon cancer screening Recommend colonoscopy with 2-day prep   Follow up in 3 months   Cephas Darby, MD

## 2021-01-03 ENCOUNTER — Telehealth: Payer: Self-pay

## 2021-01-03 DIAGNOSIS — D509 Iron deficiency anemia, unspecified: Secondary | ICD-10-CM

## 2021-01-03 LAB — CBC
Hematocrit: 34.4 % — ABNORMAL LOW (ref 37.5–51.0)
Hemoglobin: 10.1 g/dL — ABNORMAL LOW (ref 13.0–17.7)
MCH: 20.6 pg — ABNORMAL LOW (ref 26.6–33.0)
MCHC: 29.4 g/dL — ABNORMAL LOW (ref 31.5–35.7)
MCV: 70 fL — ABNORMAL LOW (ref 79–97)
Platelets: 381 10*3/uL (ref 150–450)
RBC: 4.91 x10E6/uL (ref 4.14–5.80)
RDW: 19.2 % — ABNORMAL HIGH (ref 11.6–15.4)
WBC: 7.1 10*3/uL (ref 3.4–10.8)

## 2021-01-03 LAB — B12 AND FOLATE PANEL
Folate: 6.8 ng/mL (ref >3.0)
Vitamin B-12: 240 pg/mL (ref 232–1245)

## 2021-01-03 LAB — COMPREHENSIVE METABOLIC PANEL
ALT: 11 IU/L (ref 0–44)
AST: 12 IU/L (ref 0–40)
Albumin/Globulin Ratio: 1.4 (ref 1.2–2.2)
Albumin: 4.3 g/dL (ref 3.8–4.8)
Alkaline Phosphatase: 102 IU/L (ref 44–121)
BUN/Creatinine Ratio: 13 (ref 10–24)
BUN: 15 mg/dL (ref 8–27)
Bilirubin Total: 0.3 mg/dL (ref 0.0–1.2)
CO2: 22 mmol/L (ref 20–29)
Calcium: 9 mg/dL (ref 8.6–10.2)
Chloride: 104 mmol/L (ref 96–106)
Creatinine, Ser: 1.12 mg/dL (ref 0.76–1.27)
GFR calc Af Amer: 77 mL/min/{1.73_m2} (ref >59)
GFR calc non Af Amer: 66 mL/min/{1.73_m2} (ref >59)
Globulin, Total: 3.1 g/dL (ref 1.5–4.5)
Glucose: 87 mg/dL (ref 65–99)
Potassium: 5.1 mmol/L (ref 3.5–5.2)
Sodium: 140 mmol/L (ref 134–144)
Total Protein: 7.4 g/dL (ref 6.0–8.5)

## 2021-01-03 LAB — IRON,TIBC AND FERRITIN PANEL
Ferritin: 7 ng/mL — ABNORMAL LOW (ref 30–400)
Iron Saturation: 5 % — CL (ref 15–55)
Iron: 21 ug/dL — ABNORMAL LOW (ref 38–169)
Total Iron Binding Capacity: 414 ug/dL (ref 250–450)
UIBC: 393 ug/dL — ABNORMAL HIGH (ref 111–343)

## 2021-01-03 NOTE — Telephone Encounter (Signed)
Placed referral. Can you please call patient

## 2021-01-03 NOTE — Telephone Encounter (Signed)
-----   Message from Lin Landsman, MD sent at 01/03/2021 10:57 AM EST ----- Patient has severe iron deficiency anemia.  Recommend B12 supplements 1061mcg once a day as well as referral to hematology for parenteral iron  Thanks RV

## 2021-01-11 NOTE — Telephone Encounter (Signed)
Called patient again and left him a detailed message letting him know that he will need to start taking Vitamin B12 1000 mcg daily and that he would be referred to hemology do they could contact them and schedule an him an apointment.

## 2021-01-13 ENCOUNTER — Other Ambulatory Visit
Admission: RE | Admit: 2021-01-13 | Discharge: 2021-01-13 | Disposition: A | Discharge status: Home / Self Care | Payer: Medicare Other | Source: Ambulatory Visit | Attending: Gastroenterology | Admitting: Gastroenterology

## 2021-01-16 ENCOUNTER — Other Ambulatory Visit: Payer: Self-pay

## 2021-01-16 ENCOUNTER — Encounter: Payer: Self-pay | Admitting: Gastroenterology

## 2021-01-16 ENCOUNTER — Other Ambulatory Visit
Admission: RE | Admit: 2021-01-16 | Discharge: 2021-01-16 | Disposition: A | Discharge status: Home / Self Care | Payer: Medicare Other | Source: Ambulatory Visit | Attending: Gastroenterology | Admitting: Gastroenterology

## 2021-01-16 DIAGNOSIS — Z01812 Encounter for preprocedural laboratory examination: Secondary | ICD-10-CM | POA: Insufficient documentation

## 2021-01-16 DIAGNOSIS — Z20822 Contact with and (suspected) exposure to covid-19: Secondary | ICD-10-CM | POA: Insufficient documentation

## 2021-01-16 LAB — SARS CORONAVIRUS 2 (TAT 6-24 HRS): SARS Coronavirus 2: NEGATIVE

## 2021-01-17 ENCOUNTER — Ambulatory Visit: Payer: Medicare Other | Admitting: Certified Registered"

## 2021-01-17 ENCOUNTER — Ambulatory Visit
Admission: RE | Admit: 2021-01-17 | Discharge: 2021-01-17 | Disposition: A | Discharge status: Home / Self Care | Payer: Medicare Other | Attending: Gastroenterology | Admitting: Gastroenterology

## 2021-01-17 ENCOUNTER — Encounter
Admission: RE | Disposition: A | Discharge status: Home / Self Care | Payer: Self-pay | Source: Home / Self Care | Attending: Gastroenterology

## 2021-01-17 ENCOUNTER — Encounter: Payer: Self-pay | Admitting: Gastroenterology

## 2021-01-17 ENCOUNTER — Other Ambulatory Visit: Payer: Self-pay

## 2021-01-17 DIAGNOSIS — Z87891 Personal history of nicotine dependence: Secondary | ICD-10-CM | POA: Insufficient documentation

## 2021-01-17 DIAGNOSIS — K2950 Unspecified chronic gastritis without bleeding: Secondary | ICD-10-CM | POA: Diagnosis not present

## 2021-01-17 DIAGNOSIS — Z79899 Other long term (current) drug therapy: Secondary | ICD-10-CM | POA: Diagnosis not present

## 2021-01-17 DIAGNOSIS — B9681 Helicobacter pylori [H. pylori] as the cause of diseases classified elsewhere: Secondary | ICD-10-CM | POA: Insufficient documentation

## 2021-01-17 DIAGNOSIS — Z538 Procedure and treatment not carried out for other reasons: Secondary | ICD-10-CM | POA: Insufficient documentation

## 2021-01-17 DIAGNOSIS — Z880 Allergy status to penicillin: Secondary | ICD-10-CM | POA: Insufficient documentation

## 2021-01-17 DIAGNOSIS — R1013 Epigastric pain: Secondary | ICD-10-CM | POA: Diagnosis present

## 2021-01-17 DIAGNOSIS — D509 Iron deficiency anemia, unspecified: Secondary | ICD-10-CM | POA: Insufficient documentation

## 2021-01-17 DIAGNOSIS — Z1211 Encounter for screening for malignant neoplasm of colon: Secondary | ICD-10-CM | POA: Diagnosis not present

## 2021-01-17 DIAGNOSIS — K920 Hematemesis: Secondary | ICD-10-CM | POA: Insufficient documentation

## 2021-01-17 HISTORY — PX: ESOPHAGOGASTRODUODENOSCOPY (EGD) WITH PROPOFOL: SHX5813

## 2021-01-17 HISTORY — PX: COLONOSCOPY WITH PROPOFOL: SHX5780

## 2021-01-17 HISTORY — DX: Dyspnea, unspecified: R06.00

## 2021-01-17 SURGERY — COLONOSCOPY WITH PROPOFOL
Anesthesia: General

## 2021-01-17 MED ORDER — PROPOFOL 500 MG/50ML IV EMUL
INTRAVENOUS | Status: DC | PRN
  Administered 2021-01-17: 150 ug/kg/min via INTRAVENOUS

## 2021-01-17 MED ORDER — GOLYTELY 236 G PO SOLR: 4000.0000 mL | Freq: Once | ORAL | 0 refills | Status: AC

## 2021-01-17 MED ORDER — SODIUM CHLORIDE 0.9 % IV SOLN: INTRAVENOUS | Status: DC

## 2021-01-17 MED ORDER — PROPOFOL 10 MG/ML IV BOLUS
INTRAVENOUS | Status: DC | PRN
  Administered 2021-01-17: 30 mg via INTRAVENOUS
  Administered 2021-01-17: 50 mg via INTRAVENOUS
  Administered 2021-01-17: 20 mg via INTRAVENOUS

## 2021-01-17 MED ORDER — PHENYLEPHRINE HCL (PRESSORS) 10 MG/ML IV SOLN
INTRAVENOUS | Status: DC | PRN
  Administered 2021-01-17: 100 ug via INTRAVENOUS

## 2021-01-17 MED ORDER — LIDOCAINE HCL (CARDIAC) PF 100 MG/5ML IV SOSY
PREFILLED_SYRINGE | INTRAVENOUS | Status: DC | PRN
  Administered 2021-01-17: 100 mg via INTRAVENOUS

## 2021-01-17 MED ORDER — GLYCOPYRROLATE 0.2 MG/ML IJ SOLN
INTRAMUSCULAR | Status: DC | PRN
  Administered 2021-01-17 (×3): .2 mg via INTRAVENOUS

## 2021-01-17 MED ORDER — VASOPRESSIN 20 UNIT/ML IV SOLN
INTRAVENOUS | Status: DC | PRN
  Administered 2021-01-17: 1 [IU] via INTRAVENOUS

## 2021-01-17 NOTE — Progress Notes (Signed)
Colonoscopy aborted due to poor pre[

## 2021-01-17 NOTE — Op Note (Signed)
[facility] Gastroenterology Patient Name: Jack Fisher Procedure Date: 01/17/2021 9:47 AM MRN: [id number] Account #: [id number] Date of Birth: [birth year] Admit Type: Outpatient Age: 70 Room: [facility] ENDO ROOM 3 Gender: Male Note Status: Finalized Procedure:             Colonoscopy Indications:           Screening for colorectal malignant neoplasm, This is                         the patient's first colonoscopy Providers:             Lin Landsman MD, MD Medicines:             General Anesthesia Complications:         No immediate complications. Estimated blood loss: None. Procedure:             Pre-Anesthesia Assessment:                        - Prior to the procedure, a History and Physical was                         performed, and patient medications and allergies were                         reviewed. The patient is competent. The risks and                         benefits of the procedure and the sedation options and                         risks were discussed with the patient. All questions                         were answered and informed consent was obtained.                         Patient identification and proposed procedure were                         verified by the physician, the nurse, the                         anesthesiologist, the anesthetist and the technician                         in the pre-procedure area in the procedure room in the                         endoscopy suite. Mental Status Examination: alert and                         oriented. Airway Examination: normal oropharyngeal                         airway and neck mobility. Respiratory Examination:                         clear to auscultation. CV Examination: normal.  Prophylactic Antibiotics: The patient does not require                         prophylactic antibiotics. Prior Anticoagulants: The                         patient has taken no  previous anticoagulant or                         antiplatelet agents. ASA Grade Assessment: III - A                         patient with severe systemic disease. After reviewing                         the risks and benefits, the patient was deemed in                         satisfactory condition to undergo the procedure. The                         anesthesia plan was to use general anesthesia.                         Immediately prior to administration of medications,                         the patient was re-assessed for adequacy to receive                         sedatives. The heart rate, respiratory rate, oxygen                         saturations, blood pressure, adequacy of pulmonary                         ventilation, and response to care were monitored                         throughout the procedure. The physical status of the                         patient was re-assessed after the procedure.                        After obtaining informed consent, the colonoscope was                         passed under direct vision. Throughout the procedure,                         the patient's blood pressure, pulse, and oxygen                         saturations were monitored continuously. The                         Colonoscope was introduced through the anus with the  intention of advancing to the cecum. The scope was                         advanced to the transverse colon before the procedure                         was aborted. Medications were given. The colonoscopy                         was extremely difficult due to poor bowel prep and                         significant looping. The patient tolerated the                         procedure fairly well. The quality of the bowel                         preparation was unsatisfactory. The colonoscopy was                         aborted. Findings:      The perianal and digital rectal examinations were  normal. Pertinent       negatives include normal sphincter tone and no palpable rectal lesions.      Copious quantities of semi-liquid stool was found in the entire colon,       precluding visualization.      The retroflexed view of the distal rectum and anal verge was normal and       showed no anal or rectal abnormalities. Impression:            - Preparation of the colon was unsatisfactory.                        - The procedure was aborted.                        - Stool in the entire examined colon.                        - The distal rectum and anal verge are normal on                         retroflexion view.                        - No specimens collected. Recommendation:        - Discharge patient to home (with escort).                        - Clear liquid diet today.                        - Continue present medications.                        - Repeat colonoscopy tomorrow because the bowel                         preparation was poor. Procedure Code(s):     ---  Professional ---                        V1292, 53, Colorectal cancer screening; colonoscopy on                         individual not meeting criteria for high risk Diagnosis Code(s):     --- Professional ---                        Z12.11, Encounter for screening for malignant neoplasm                         of colon CPT copyright 2019 American Medical Association. All rights reserved. The codes documented in this report are preliminary and upon coder review may  be revised to meet current compliance requirements. Dr. Ulyess Mort Lin Landsman MD, MD 01/17/2021 10:25:36 AM This report has been signed electronically. Number of Addenda: 0 Note Initiated On: 01/17/2021 9:47 AM Total Procedure Duration: 0 hours 11 minutes 26 seconds  Estimated Blood Loss:  Estimated blood loss: none.      [facility]

## 2021-01-17 NOTE — Anesthesia Procedure Notes (Signed)
Procedure Name: General with mask airway Performed by: Fletcher-Harrison, Ezequiel Macauley, CRNA Pre-anesthesia Checklist: Patient identified, Emergency Drugs available, Suction available and Patient being monitored Patient Re-evaluated:Patient Re-evaluated prior to induction Oxygen Delivery Method: Simple face mask Induction Type: IV induction Placement Confirmation: positive ETCO2 and CO2 detector Dental Injury: Teeth and Oropharynx as per pre-operative assessment        

## 2021-01-17 NOTE — H&P (Signed)
Cephas Darby, MD [address]  [place]  [place], [place] [address]  Main: [phone]  Fax: [phone] Pager: [phone]  Primary Care Physician:  Theotis Burrow, MD Primary Gastroenterologist:  Dr. Cephas Darby  Pre-Procedure History & Physical: HPI:  Jack Fisher is a 70 y.o. male is here for an endoscopy and colonoscopy.   Past Medical History:  Diagnosis Date   Anginal pain ([place])    Anxiety    Arthritis    knees, elbows   Coronary artery disease    Depression    Diabetes mellitus without complication (HCC)    Dyspnea    GERD (gastroesophageal reflux disease)    Headache    Hypertension    Pneumonia     Past Surgical History:  Procedure Laterality Date   CARDIAC CATHETERIZATION     LEFT HEART CATH AND CORONARY ANGIOGRAPHY N/A 11/05/2018   Procedure: LEFT HEART CATH AND CORONARY ANGIOGRAPHY;  Surgeon: Yolonda Kida, MD;  Location: [facility];  Service: Cardiovascular;  Laterality: N/A;    Prior to Admission medications   Medication Sig Start Date End Date Taking? Authorizing Provider  amLODipine (NORVASC) 5 MG tablet Take by mouth.   Yes [provider]  FLOMAX 0.4 MG CAPS capsule Take 0.4 mg by mouth at bedtime. 09/23/20  Yes [provider]  lisinopril-hydrochlorothiazide (ZESTORETIC) 20-12.5 MG tablet Take 1 tablet by mouth daily.   Yes [provider]  LORazepam (ATIVAN) 1 MG tablet Take 1 mg by mouth every 8 (eight) hours.   Yes [provider]  pantoprazole (PROTONIX) 40 MG tablet Take 40 mg by mouth daily. 08/23/20  Yes [provider]    Allergies as of 01/02/2021 - Review Complete 01/02/2021  Allergen Reaction Noted   Penicillins  11/05/2018    History reviewed. No pertinent family history.  Social History   Socioeconomic History   Marital status: Legally Separated    Spouse name: Not on file   Number of children: Not on file   Years  of education: Not on file   Highest education level: Not on file  Occupational History   Not on file  Tobacco Use   Smoking status: Former Smoker   Smokeless tobacco: Never Used  Scientific laboratory technician Use: Never used  Substance and Sexual Activity   Alcohol use: Not Currently    Comment: quit one month ago   Drug use: Never   Sexual activity: Not on file  Other Topics Concern   Not on file  Social History Narrative   Not on file   Social Determinants of Health   Financial Resource Strain: Not on file  Food Insecurity: Not on file  Transportation Needs: Not on file  Physical Activity: Not on file  Stress: Not on file  Social Connections: Not on file  Intimate Partner Violence: Not on file    Review of Systems: See HPI, otherwise negative ROS  Physical Exam: BP 117/68    Pulse (!) 58    Temp 97.8 F (36.6 C)    Resp 18    Ht 5\' 6"  (1.676 m)    Wt 90.7 kg    SpO2 100%    BMI 32.28 kg/m  General:   Alert,  pleasant and cooperative in NAD Head:  Normocephalic and atraumatic. Neck:  Supple; no masses or thyromegaly. Lungs:  Clear throughout to auscultation.    Heart:  Regular rate and rhythm. Abdomen:  Soft, nontender and nondistended. Normal  bowel sounds, without guarding, and without rebound.   Neurologic:  Alert and  oriented x4;  grossly normal neurologically.  Impression/Plan: Jack Fisher is here for an endoscopy and colonoscopy to be performed for epigastric pain and colon cancer screening  Risks, benefits, limitations, and alternatives regarding  endoscopy and colonoscopy have been reviewed with the patient.  Questions have been answered.  All parties agreeable.   Sherri Sear, MD  01/17/2021, 9:48 AM

## 2021-01-17 NOTE — Anesthesia Postprocedure Evaluation (Signed)
Anesthesia Post Note  Patient: Jack Fisher  Procedure(s) Performed: COLONOSCOPY WITH PROPOFOL (N/A ) ESOPHAGOGASTRODUODENOSCOPY (EGD) WITH PROPOFOL (N/A )  Patient location during evaluation: Phase II Anesthesia Type: General Level of consciousness: awake and alert, awake and oriented Pain management: pain level controlled Vital Signs Assessment: post-procedure vital signs reviewed and stable Respiratory status: spontaneous breathing, nonlabored ventilation and respiratory function stable Cardiovascular status: blood pressure returned to baseline and stable Postop Assessment: no apparent nausea or vomiting Anesthetic complications: no   No complications documented.   Last Vitals:  Vitals:   01/17/21 0841 01/17/21 1020  BP: 117/68 126/68  Pulse: (!) 58 61  Resp: 18 16  Temp: 36.6 C 36.6 C  SpO2: 100% 100%    Last Pain: There were no vitals filed for this visit.               Phill Mutter

## 2021-01-17 NOTE — Anesthesia Preprocedure Evaluation (Signed)
Anesthesia Evaluation  Patient identified by MRN, date of birth, ID band Patient awake    Reviewed: Allergy & Precautions, H&P , NPO status , Patient's Chart, lab work & pertinent test results  Airway Mallampati: III  TM Distance: >3 FB Neck ROM: Full    Dental no notable dental hx.    Pulmonary shortness of breath and with exertion, pneumonia, resolved, former smoker,    Pulmonary exam normal        Cardiovascular hypertension, + angina + CAD  Normal cardiovascular exam     Neuro/Psych  Headaches, PSYCHIATRIC DISORDERS Anxiety Depression    GI/Hepatic Neg liver ROS, GERD  Controlled,  Endo/Other  negative endocrine ROSdiabetes, Well Controlled  Renal/GU negative Renal ROS  negative genitourinary   Musculoskeletal  (+) Arthritis ,   Abdominal   Peds negative pediatric ROS (+)  Hematology negative hematology ROS (+)   Anesthesia Other Findings  Anginal pain ([place])  . Anxiety  . Arthritis   knees, elbows . Coronary artery disease  . Depression  . Diabetes mellitus without complication ([place])  . GERD (gastroesophageal reflux disease)  . Headache  . Hypertension  . Pneumonia     Reproductive/Obstetrics negative OB ROS                             Anesthesia Physical Anesthesia Plan  ASA: III  Anesthesia Plan: General   Post-op Pain Management:    Induction: Intravenous  PONV Risk Score and Plan: 2  Airway Management Planned: Natural Airway and Nasal Cannula  Additional Equipment:   Intra-op Plan:   Post-operative Plan:   Informed Consent: I have reviewed the patients History and Physical, chart, labs and discussed the procedure including the risks, benefits and alternatives for the proposed anesthesia with the patient or authorized representative who has indicated his/her understanding and acceptance.       Plan Discussed with: CRNA and  Anesthesiologist  Anesthesia Plan Comments:         Anesthesia Quick Evaluation

## 2021-01-17 NOTE — Transfer of Care (Signed)
Immediate Anesthesia Transfer of Care Note  Patient: Jack Fisher  Procedure(s) Performed: COLONOSCOPY WITH PROPOFOL (N/A ) ESOPHAGOGASTRODUODENOSCOPY (EGD) WITH PROPOFOL (N/A )  Patient Location: Endoscopy Unit  Anesthesia Type:General  Level of Consciousness: drowsy and patient cooperative  Airway & Oxygen Therapy: Patient Spontanous Breathing and Patient connected to face mask oxygen  Post-op Assessment: Report given to RN and Post -op Vital signs reviewed and stable  Post vital signs: Reviewed and stable  Last Vitals:  Vitals Value Taken Time  BP    Temp    Pulse 60 01/17/21 1031  Resp 15 01/17/21 1031  SpO2 100 % 01/17/21 1031  Vitals shown include unvalidated device data.  Last Pain: There were no vitals filed for this visit.       Complications: No complications documented.

## 2021-01-17 NOTE — Progress Notes (Signed)
Patient had a poor prep is going to repeat colonoscopy tomorrow. Sent prep to the pharmacy per Dr. Marius Ditch request for Golytely

## 2021-01-17 NOTE — Op Note (Signed)
[facility] Gastroenterology Patient Name: Jack Fisher Procedure Date: 01/17/2021 9:48 AM MRN: [id number] Account #: [id number] Date of Birth: March 21, 1951 Admit Type: Outpatient Age: 70 Room: [facility] ENDO ROOM 3 Gender: Male Note Status: Finalized Procedure:             Upper GI endoscopy Indications:           Epigastric abdominal pain, Unexplained iron deficiency                         anemia, Hematemesis Providers:             Lin Landsman MD, MD Medicines:             General Anesthesia Complications:         No immediate complications. Estimated blood loss: None. Procedure:             Pre-Anesthesia Assessment:                        - Prior to the procedure, a History and Physical was                         performed, and patient medications and allergies were                         reviewed. The patient is competent. The risks and                         benefits of the procedure and the sedation options and                         risks were discussed with the patient. All questions                         were answered and informed consent was obtained.                         Patient identification and proposed procedure were                         verified by the physician, the nurse, the                         anesthesiologist, the anesthetist and the technician                         in the pre-procedure area in the procedure room in the                         endoscopy suite. Mental Status Examination: alert and                         oriented. Airway Examination: normal oropharyngeal                         airway and neck mobility. Respiratory Examination:                         clear to auscultation. CV Examination: normal.  Prophylactic Antibiotics: The patient does not require                         prophylactic antibiotics. Prior Anticoagulants: The                         patient has taken no  previous anticoagulant or                         antiplatelet agents. ASA Grade Assessment: III - A                         patient with severe systemic disease. After reviewing                         the risks and benefits, the patient was deemed in                         satisfactory condition to undergo the procedure. The                         anesthesia plan was to use general anesthesia.                         Immediately prior to administration of medications,                         the patient was re-assessed for adequacy to receive                         sedatives. The heart rate, respiratory rate, oxygen                         saturations, blood pressure, adequacy of pulmonary                         ventilation, and response to care were monitored                         throughout the procedure. The physical status of the                         patient was re-assessed after the procedure.                        After obtaining informed consent, the endoscope was                         passed under direct vision. Throughout the procedure,                         the patient's blood pressure, pulse, and oxygen                         saturations were monitored continuously. The Endoscope                         was introduced through the mouth, and advanced to the  second part of duodenum. The upper GI endoscopy was                         accomplished without difficulty. The patient tolerated                         the procedure well. Findings:      The duodenal bulb and second portion of the duodenum were normal.      Multiple dispersed diminutive erosions with no bleeding and no stigmata       of recent bleeding were found at the incisura and in the gastric antrum.       Biopsies were taken with a cold forceps for Helicobacter pylori testing.      The cardia and gastric fundus were normal on retroflexion.      Esophagogastric landmarks were  identified: the gastroesophageal junction       was found at 40 cm from the incisors.      The gastroesophageal junction and examined esophagus were normal. Impression:            - Normal duodenal bulb and second portion of the                         duodenum.                        - Erosive gastropathy with no bleeding and no stigmata                         of recent bleeding. Biopsied.                        - Esophagogastric landmarks identified.                        - Normal gastroesophageal junction and esophagus. Recommendation:        - Await pathology results.                        - Continue present medications.                        - Proceed with colonoscopy as scheduled                        See colonoscopy report Procedure Code(s):     --- Professional ---                        [phone], Esophagogastroduodenoscopy, flexible,                         transoral; with biopsy, single or multiple Diagnosis Code(s):     --- Professional ---                        K31.89, Other diseases of stomach and duodenum                        K92.0, Hematemesis                        D50.9, Iron deficiency anemia,  unspecified                        R10.13, Epigastric pain CPT copyright 2019 American Medical Association. All rights reserved. The codes documented in this report are preliminary and upon coder review may  be revised to meet current compliance requirements. Dr. Ulyess Mort Lin Landsman MD, MD 01/17/2021 10:09:47 AM This report has been signed electronically. Number of Addenda: 0 Note Initiated On: 01/17/2021 9:48 AM Estimated Blood Loss:  Estimated blood loss: none.      [facility]

## 2021-01-17 NOTE — Anesthesia Postprocedure Evaluation (Signed)
Anesthesia Post Note  Patient: Monterius Rolf  Procedure(s) Performed: COLONOSCOPY WITH PROPOFOL (N/A ) ESOPHAGOGASTRODUODENOSCOPY (EGD) WITH PROPOFOL (N/A )  Patient location during evaluation: Phase II Anesthesia Type: General Level of consciousness: awake and alert, awake and oriented Pain management: pain level controlled Vital Signs Assessment: post-procedure vital signs reviewed and stable Respiratory status: spontaneous breathing, nonlabored ventilation and respiratory function stable Cardiovascular status: blood pressure returned to baseline and stable Postop Assessment: no apparent nausea or vomiting Anesthetic complications: no   No complications documented.   Last Vitals:  Vitals:   01/17/21 0841 01/17/21 1020  BP: 117/68 126/68  Pulse: (!) 58 61  Resp: 18 16  Temp: 36.6 C 36.6 C  SpO2: 100% 100%    Last Pain: There were no vitals filed for this visit.               Phill Mutter

## 2021-01-18 ENCOUNTER — Other Ambulatory Visit: Payer: Self-pay | Admitting: Gastroenterology

## 2021-01-18 ENCOUNTER — Ambulatory Visit: Payer: Medicare Other | Admitting: Anesthesiology

## 2021-01-18 ENCOUNTER — Ambulatory Visit
Admission: RE | Admit: 2021-01-18 | Discharge: 2021-01-18 | Disposition: A | Discharge status: Home / Self Care | Payer: Medicare Other | Attending: Gastroenterology | Admitting: Gastroenterology

## 2021-01-18 ENCOUNTER — Other Ambulatory Visit: Payer: Self-pay

## 2021-01-18 ENCOUNTER — Encounter: Payer: Self-pay | Admitting: Gastroenterology

## 2021-01-18 ENCOUNTER — Encounter
Admission: RE | Disposition: A | Discharge status: Home / Self Care | Payer: Self-pay | Source: Home / Self Care | Attending: Gastroenterology

## 2021-01-18 ENCOUNTER — Telehealth: Payer: Self-pay

## 2021-01-18 DIAGNOSIS — Z1211 Encounter for screening for malignant neoplasm of colon: Secondary | ICD-10-CM | POA: Insufficient documentation

## 2021-01-18 DIAGNOSIS — Z79899 Other long term (current) drug therapy: Secondary | ICD-10-CM | POA: Diagnosis not present

## 2021-01-18 DIAGNOSIS — A048 Other specified bacterial intestinal infections: Secondary | ICD-10-CM

## 2021-01-18 DIAGNOSIS — Z880 Allergy status to penicillin: Secondary | ICD-10-CM | POA: Insufficient documentation

## 2021-01-18 DIAGNOSIS — Z9189 Other specified personal risk factors, not elsewhere classified: Secondary | ICD-10-CM

## 2021-01-18 DIAGNOSIS — C182 Malignant neoplasm of ascending colon: Secondary | ICD-10-CM

## 2021-01-18 DIAGNOSIS — Z87891 Personal history of nicotine dependence: Secondary | ICD-10-CM | POA: Insufficient documentation

## 2021-01-18 DIAGNOSIS — D500 Iron deficiency anemia secondary to blood loss (chronic): Secondary | ICD-10-CM

## 2021-01-18 HISTORY — PX: COLONOSCOPY WITH PROPOFOL: SHX5780

## 2021-01-18 LAB — SURGICAL PATHOLOGY

## 2021-01-18 SURGERY — COLONOSCOPY WITH PROPOFOL
Anesthesia: General

## 2021-01-18 MED ORDER — PROPOFOL 500 MG/50ML IV EMUL
INTRAVENOUS | Status: DC | PRN
  Administered 2021-01-18: 150 ug/kg/min via INTRAVENOUS

## 2021-01-18 MED ORDER — PROPOFOL 500 MG/50ML IV EMUL
INTRAVENOUS | Status: AC
  Filled 2021-01-18: qty 50

## 2021-01-18 MED ORDER — LIDOCAINE HCL (CARDIAC) PF 100 MG/5ML IV SOSY
PREFILLED_SYRINGE | INTRAVENOUS | Status: DC | PRN
  Administered 2021-01-18: 40 mg via INTRAVENOUS

## 2021-01-18 MED ORDER — PROPOFOL 10 MG/ML IV BOLUS
INTRAVENOUS | Status: DC | PRN
  Administered 2021-01-18: 90 mg via INTRAVENOUS

## 2021-01-18 MED ORDER — OMEPRAZOLE 20 MG PO CPDR: 20.0000 mg | DELAYED_RELEASE_CAPSULE | Freq: Two times a day (BID) | ORAL | 0 refills | Status: DC

## 2021-01-18 MED ORDER — CLARITHROMYCIN 500 MG PO TABS: 500.0000 mg | ORAL_TABLET | Freq: Two times a day (BID) | ORAL | 0 refills | Status: DC

## 2021-01-18 MED ORDER — SODIUM CHLORIDE 0.9 % IV SOLN: INTRAVENOUS | Status: DC

## 2021-01-18 MED ORDER — METRONIDAZOLE 500 MG PO TABS: 500.0000 mg | ORAL_TABLET | Freq: Three times a day (TID) | ORAL | 0 refills | Status: DC

## 2021-01-18 NOTE — H&P (Signed)
Cephas Darby, MD [address]  [place]  [place], [place] [address]  Main: [phone]  Fax: [phone] Pager: [phone]  Primary Care Physician:  Theotis Burrow, MD Primary Gastroenterologist:  Dr. Cephas Darby  Pre-Procedure History & Physical: HPI:  Jack Fisher is a 70 y.o. male is here for an colonoscopy.   Past Medical History:  Diagnosis Date  . Anginal pain ([place])   . Anxiety   . Arthritis    knees, elbows  . Coronary artery disease   . Depression   . Diabetes mellitus without complication ([place])   . Dyspnea   . GERD (gastroesophageal reflux disease)   . Headache   . Hypertension   . Pneumonia     Past Surgical History:  Procedure Laterality Date  . CARDIAC CATHETERIZATION    . COLONOSCOPY WITH PROPOFOL N/A 01/17/2021   Procedure: COLONOSCOPY WITH PROPOFOL;  Surgeon: Lin Landsman, MD;  Location: [facility] ENDOSCOPY;  Service: Gastroenterology;  Laterality: N/A;  Pending C-19 test from 2/21 [facility] INTERPRETER  . ESOPHAGOGASTRODUODENOSCOPY (EGD) WITH PROPOFOL N/A 01/17/2021   Procedure: ESOPHAGOGASTRODUODENOSCOPY (EGD) WITH PROPOFOL;  Surgeon: Lin Landsman, MD;  Location: [facility] ENDOSCOPY;  Service: Gastroenterology;  Laterality: N/A;  . LEFT HEART CATH AND CORONARY ANGIOGRAPHY N/A 11/05/2018   Procedure: LEFT HEART CATH AND CORONARY ANGIOGRAPHY;  Surgeon: Yolonda Kida, MD;  Location: [facility] LAB;  Service: Cardiovascular;  Laterality: N/A;    Prior to Admission medications   Medication Sig Start Date End Date Taking? Authorizing Provider  amLODipine (NORVASC) 5 MG tablet Take by mouth.   Yes [provider]  FLOMAX 0.4 MG CAPS capsule Take 0.4 mg by mouth at bedtime. 09/23/20  Yes [provider]  LORazepam (ATIVAN) 1 MG tablet Take 1 mg by mouth every 8 (eight) hours.   Yes [provider]  pantoprazole (PROTONIX) 40 MG tablet Take 40 mg by mouth daily. 08/23/20  Yes [provider]  lisinopril-hydrochlorothiazide (ZESTORETIC) 20-12.5 MG tablet Take 1 tablet by mouth daily.    [provider]    Allergies as of 01/17/2021 - Review Complete 01/17/2021  Allergen Reaction Noted  . Penicillins  11/05/2018    History reviewed. No pertinent family history.  Social History   Socioeconomic History  . Marital status: Legally Separated    Spouse name: Not on file  . Number of children: Not on file  . Years of education: Not on file  . Highest education level: Not on file  Occupational History  . Not on file  Tobacco Use  . Smoking status: Former Research scientist (life sciences)  . Smokeless tobacco: Never Used  Vaping Use  . Vaping Use: Never used  Substance and Sexual Activity  . Alcohol use: Not Currently    Comment: quit one month ago  . Drug use: Never  . Sexual activity: Not on file  Other Topics Concern  . Not on file  Social History Narrative  . Not on file   Social Determinants of Health   Financial Resource Strain: Not on file  Food Insecurity: Not on file  Transportation Needs: Not on file  Physical Activity: Not on file  Stress: Not on file  Social Connections: Not on file  Intimate Partner Violence: Not on file    Review of Systems: See HPI, otherwise negative ROS  Physical Exam: BP (!) 141/82   Pulse (!) 58   Temp (!) 97.3 F (36.3 C) (Temporal)   Resp 18   Ht 5'  6" (1.676 m)   Wt 111.6 kg   SpO2 100%   BMI 39.71 kg/m  General:   Alert,  pleasant and cooperative in NAD Head:  Normocephalic and atraumatic. Neck:  Supple; no masses or thyromegaly. Lungs:  Clear throughout to auscultation.    Heart:  Regular rate and rhythm. Abdomen:  Soft, nontender and nondistended. Normal bowel sounds, without guarding, and without rebound.   Neurologic:  Alert and  oriented x4;  grossly normal neurologically.  Impression/Plan: Jack Fisher is here for an colonoscopy to be performed for colon cancer screening  Risks,  benefits, limitations, and alternatives regarding  colonoscopy have been reviewed with the patient.  Questions have been answered.  All parties agreeable.   Sherri Sear, MD  01/18/2021, 10:55 AM

## 2021-01-18 NOTE — Telephone Encounter (Signed)
Used interpreter services and called patient and left a message for call back. Sent medication to the pharmacy. Order Repeat H pylori test

## 2021-01-18 NOTE — Anesthesia Postprocedure Evaluation (Signed)
Anesthesia Post Note  Patient: Jack Fisher  Procedure(s) Performed: COLONOSCOPY WITH PROPOFOL (N/A )  Patient location during evaluation: Phase II Anesthesia Type: General Level of consciousness: awake and alert, awake and oriented Pain management: pain level controlled Vital Signs Assessment: post-procedure vital signs reviewed and stable Respiratory status: spontaneous breathing, nonlabored ventilation and respiratory function stable Cardiovascular status: blood pressure returned to baseline and stable Postop Assessment: no apparent nausea or vomiting Anesthetic complications: no   No complications documented.   Last Vitals:  Vitals:   01/18/21 1002 01/18/21 1247  BP: (!) 141/82 (!) 103/59  Pulse: (!) 58 (!) 53  Resp: 18 17  Temp: (!) 36.3 C (!) 36.2 C  SpO2: 100% 98%    Last Pain:  Vitals:   01/18/21 1337  TempSrc:   PainSc: 0-No pain                 Phill Mutter

## 2021-01-18 NOTE — Progress Notes (Signed)
DR. Marius Ditch order a Ct colonoscopy for this patient. Patient will get a call from [facility] about getting it scheduled. They are booked out till the week of March 21. Faxed orders to [phone]

## 2021-01-18 NOTE — Telephone Encounter (Signed)
-----   Message from Lin Landsman, MD sent at 01/18/2021  2:58 PM EST ----- Biopsy results reveal Helicobacter pylori infection. Plz send him the prescription for triple therapy to treat H Pylori for 14days. He is allergic to penicillin  Omeprazole 20mg  BID Clarithromycin 500mg  BID Metronidazole 500 mg TID  Order H Pylori breath test in 4weeks after completing medication to confirm eradication.  Thanks RV

## 2021-01-18 NOTE — Anesthesia Preprocedure Evaluation (Signed)
Anesthesia Evaluation  Patient identified by MRN, date of birth, ID band Patient awake    Reviewed: Allergy & Precautions, H&P , NPO status , Patient's Chart, lab work & pertinent test results  Airway Mallampati: III  TM Distance: >3 FB Neck ROM: Full    Dental no notable dental hx.    Pulmonary shortness of breath and with exertion, pneumonia, resolved, former smoker,    Pulmonary exam normal        Cardiovascular hypertension, + angina + CAD  Normal cardiovascular exam     Neuro/Psych  Headaches, PSYCHIATRIC DISORDERS Anxiety Depression    GI/Hepatic Neg liver ROS, GERD  Controlled,  Endo/Other  negative endocrine ROSdiabetes, Well Controlled  Renal/GU negative Renal ROS  negative genitourinary   Musculoskeletal  (+) Arthritis ,   Abdominal   Peds negative pediatric ROS (+)  Hematology negative hematology ROS (+)   Anesthesia Other Findings  Anginal pain ([place])  . Anxiety  . Arthritis   knees, elbows . Coronary artery disease  . Depression  . Diabetes mellitus without complication ([place])  . GERD (gastroesophageal reflux disease)  . Headache  . Hypertension  . Pneumonia     Reproductive/Obstetrics negative OB ROS                             Anesthesia Physical  Anesthesia Plan  ASA: III  Anesthesia Plan: General   Post-op Pain Management:    Induction: Intravenous  PONV Risk Score and Plan: 2 and TIVA and Propofol infusion  Airway Management Planned: Natural Airway and Nasal Cannula  Additional Equipment:   Intra-op Plan:   Post-operative Plan:   Informed Consent: I have reviewed the patients History and Physical, chart, labs and discussed the procedure including the risks, benefits and alternatives for the proposed anesthesia with the patient or authorized representative who has indicated his/her understanding and acceptance.       Plan Discussed  with: CRNA, Anesthesiologist and Surgeon  Anesthesia Plan Comments:         Anesthesia Quick Evaluation

## 2021-01-18 NOTE — Telephone Encounter (Signed)
Patient verbalized understanding of results will pick up medications

## 2021-01-18 NOTE — Anesthesia Procedure Notes (Signed)
Date/Time: 01/18/2021 12:04 PM Performed by: Doreen Salvage, CRNA Pre-anesthesia Checklist: Patient identified, Emergency Drugs available, Suction available and Patient being monitored Patient Re-evaluated:Patient Re-evaluated prior to induction Oxygen Delivery Method: Nasal cannula Induction Type: IV induction Dental Injury: Teeth and Oropharynx as per pre-operative assessment  Comments: Nasal cannula with etCO2 monitoring

## 2021-01-18 NOTE — Transfer of Care (Signed)
Immediate Anesthesia Transfer of Care Note  Patient: Toby Breithaupt  Procedure(s) Performed: Procedure(s): COLONOSCOPY WITH PROPOFOL (N/A)  Patient Location: PACU and Endoscopy Unit  Anesthesia Type:General  Level of Consciousness: sedated  Airway & Oxygen Therapy: Patient Spontanous Breathing and Patient connected to nasal cannula oxygen  Post-op Assessment: Report given to RN and Post -op Vital signs reviewed and stable  Post vital signs: Reviewed and stable  Last Vitals:  Vitals:   01/18/21 1002 01/18/21 1247  BP: (!) 141/82 (!) 103/59  Pulse: (!) 58 (!) 53  Resp: 18 17  Temp: (!) 36.3 C (!) 36.2 C  SpO2: 213% 08%    Complications: No apparent anesthesia complications

## 2021-01-18 NOTE — Op Note (Signed)
[facility] Gastroenterology Patient Name: Jack Fisher Procedure Date: 01/18/2021 11:57 AM MRN: [id number] Account #: [id number] Date of Birth: June 15, 1951 Admit Type: Outpatient Age: 70 Room: [facility] ENDO ROOM 3 Gender: Male Note Status: Finalized Procedure:             Colonoscopy Indications:           Screening for colorectal malignant neoplasm, Screening                         for colorectal malignant neoplasm, inadequate bowel                         prep on last colonoscopy (more recent than 10 years                         ago) Providers:             Lin Landsman MD, MD Medicines:             General Anesthesia Complications:         No immediate complications. Estimated blood loss: None. Procedure:             Pre-Anesthesia Assessment:                        - Prior to the procedure, a History and Physical was                         performed, and patient medications and allergies were                         reviewed. The patient is competent. The risks and                         benefits of the procedure and the sedation options and                         risks were discussed with the patient. All questions                         were answered and informed consent was obtained.                         Patient identification and proposed procedure were                         verified by the physician, the nurse, the                         anesthesiologist, the anesthetist and the technician                         in the pre-procedure area in the procedure room in the                         endoscopy suite. Mental Status Examination: alert and                         oriented. Airway Examination: normal oropharyngeal  airway and neck mobility. Respiratory Examination:                         clear to auscultation. CV Examination: normal.                         Prophylactic Antibiotics: The patient does not  require                         prophylactic antibiotics. Prior Anticoagulants: The                         patient has taken no previous anticoagulant or                         antiplatelet agents. ASA Grade Assessment: III - A                         patient with severe systemic disease. After reviewing                         the risks and benefits, the patient was deemed in                         satisfactory condition to undergo the procedure. The                         anesthesia plan was to use general anesthesia.                         Immediately prior to administration of medications,                         the patient was re-assessed for adequacy to receive                         sedatives. The heart rate, respiratory rate, oxygen                         saturations, blood pressure, adequacy of pulmonary                         ventilation, and response to care were monitored                         throughout the procedure. The physical status of the                         patient was re-assessed after the procedure.                        After obtaining informed consent, the colonoscope was                         passed under direct vision. Throughout the procedure,                         the patient's blood pressure, pulse, and oxygen  saturations were monitored continuously. The                         Colonoscope was introduced through the anus and                         advanced to the the transverse colon. The colonoscopy                         was extremely difficult due to a redundant colon and                         the patient's body habitus. Successful completion of                         the procedure was aided by changing the patient to a                         supine position, changing the patient to a prone                         position, withdrawing and reinserting the scope,                         applying abdominal  pressure and receiving assistance                         from additional staff. The patient tolerated the                         procedure well. The quality of the bowel preparation                         was fair. Findings:      The perianal and digital rectal examinations were normal. Pertinent       negatives include normal sphincter tone and no palpable rectal lesions.      Copious quantities of liquid stool was found in the entire colon,       without interference to visualization.      The retroflexed view of the distal rectum and anal verge was normal and       showed no anal or rectal abnormalities. No evidence of large polyp or       mass      Procedure is incomplete as unable to reach cecum despite change in       patient's position and manual pressure due to extremely redundant colon       and significant looping Impression:            - Preparation of the colon was fair.                        - Stool in the entire examined colon.                        - The distal rectum and anal verge are normal on                         retroflexion view.                        -  No specimens collected. Recommendation:        - Discharge patient to home (with escort).                        - Clear liquid diet today.                        - Continue present medications.                        - Perform a CT scan (computed tomography) colonography                         at appointment to be scheduled. Procedure Code(s):     --- Professional ---                        V7482, 53, Colorectal cancer screening; colonoscopy on                         individual not meeting criteria for high risk Diagnosis Code(s):     --- Professional ---                        Z12.11, Encounter for screening for malignant neoplasm                         of colon CPT copyright 2019 [employer]. All rights reserved. The codes documented in this report are preliminary and upon coder  review may  be revised to meet current compliance requirements. Dr. Ulyess Mort Lin Landsman MD, MD 01/18/2021 12:46:54 PM This report has been signed electronically. Number of Addenda: 0 Note Initiated On: 01/18/2021 11:57 AM Scope Withdrawal Time: 0 hours 0 minutes 57 seconds  Total Procedure Duration: 0 hours 31 minutes 8 seconds  Estimated Blood Loss:  Estimated blood loss: none.      [facility]

## 2021-01-19 ENCOUNTER — Encounter: Payer: Self-pay | Admitting: Gastroenterology

## 2021-01-19 ENCOUNTER — Inpatient Hospital Stay: Payer: Medicare Other | Attending: Oncology | Admitting: Oncology

## 2021-01-19 ENCOUNTER — Inpatient Hospital Stay: Payer: Medicare Other

## 2021-01-19 VITALS — BP 131/67 | HR 56 | Temp 98.1°F | Resp 18 | Wt 244.3 lb

## 2021-01-19 DIAGNOSIS — F419 Anxiety disorder, unspecified: Secondary | ICD-10-CM | POA: Insufficient documentation

## 2021-01-19 DIAGNOSIS — U071 COVID-19: Secondary | ICD-10-CM | POA: Diagnosis not present

## 2021-01-19 DIAGNOSIS — K219 Gastro-esophageal reflux disease without esophagitis: Secondary | ICD-10-CM | POA: Diagnosis not present

## 2021-01-19 DIAGNOSIS — Z79899 Other long term (current) drug therapy: Secondary | ICD-10-CM | POA: Diagnosis not present

## 2021-01-19 DIAGNOSIS — E538 Deficiency of other specified B group vitamins: Secondary | ICD-10-CM | POA: Diagnosis not present

## 2021-01-19 DIAGNOSIS — E119 Type 2 diabetes mellitus without complications: Secondary | ICD-10-CM | POA: Insufficient documentation

## 2021-01-19 DIAGNOSIS — Z87891 Personal history of nicotine dependence: Secondary | ICD-10-CM | POA: Diagnosis not present

## 2021-01-19 DIAGNOSIS — I2510 Atherosclerotic heart disease of native coronary artery without angina pectoris: Secondary | ICD-10-CM | POA: Diagnosis not present

## 2021-01-19 DIAGNOSIS — D509 Iron deficiency anemia, unspecified: Secondary | ICD-10-CM | POA: Diagnosis present

## 2021-01-19 DIAGNOSIS — R42 Dizziness and giddiness: Secondary | ICD-10-CM | POA: Diagnosis not present

## 2021-01-19 DIAGNOSIS — I10 Essential (primary) hypertension: Secondary | ICD-10-CM | POA: Diagnosis not present

## 2021-01-19 DIAGNOSIS — M129 Arthropathy, unspecified: Secondary | ICD-10-CM | POA: Diagnosis not present

## 2021-01-19 NOTE — Progress Notes (Signed)
Hematology/Oncology Consult note [facility] Telephone:([phone] [phone]  Patient Care Team: Alene Mires Elyse Jarvis, MD as PCP - General (Family Medicine)   Name of the patient: Jack Fisher  [id number]  Mar 01, 1951    Reason for referral-iron deficiency anemia   Referring physician-Dr. Marius Ditch  Date of visit: 01/19/21   History of presenting illness- Patient is a 70 year old Hispanic male with a past medical history significant for hypertension who was recently seen by Dr. Marius Ditch for epigastric pain and colon cancer screening.  He underwent EGD and colonoscopy on 01/18/2021.  EGD showed mild dispersed erosions with no bleeding in the gastric antrum.  Duodenum was normal.  Normal GE junction and esophagus.  Colonoscopy showed incomplete procedure due to redundant colon and significant looping as well as copious quantities of liquid stool in the entire colon.  Blod work from February 2022 showed White count of 7.1, H&H of 10.1/34.4 with an MCV of 70 and a platelet count of 381.  Iron studies showed ferritin of 7 low iron saturation of 5% with an elevated TIBC of 414.  B12 was low at 240 and folate were normal.  History obtained with the help of Spanish interpreter.  Patient denies any bleeding in stool or urine.  No family history of colon cancer denies any dark melanotic stools.  Reports some lightheadedness when he stands up.  ECOG PS- 1  Pain scale- 0   Review of systems- Review of Systems  Constitutional: Negative for chills, fever, malaise/fatigue and weight loss.  HENT: Negative for congestion, ear discharge and nosebleeds.   Eyes: Negative for blurred vision.  Respiratory: Negative for cough, hemoptysis, sputum production, shortness of breath and wheezing.   Cardiovascular: Negative for chest pain, palpitations, orthopnea and claudication.  Gastrointestinal: Negative for abdominal pain, blood in stool, constipation, diarrhea,  heartburn, melena, nausea and vomiting.  Genitourinary: Negative for dysuria, flank pain, frequency, hematuria and urgency.  Musculoskeletal: Negative for back pain, joint pain and myalgias.  Skin: Negative for rash.  Neurological: Positive for dizziness. Negative for tingling, focal weakness, seizures, weakness and headaches.  Endo/Heme/Allergies: Does not bruise/bleed easily.  Psychiatric/Behavioral: Negative for depression and suicidal ideas. The patient does not have insomnia.     Allergies  Allergen Reactions  . Penicillins     Patient Active Problem List   Diagnosis Date Noted  . Colon cancer screening   . COVID-19 virus infection 02/10/2020  . Type 2 diabetes mellitus ([place]) 02/10/2020  . Essential hypertension 04/18/2018  . Dizziness 04/11/2018  . Headache disorder 04/11/2018     Past Medical History:  Diagnosis Date  . Anginal pain ([place])   . Anxiety   . Arthritis    knees, elbows  . Coronary artery disease   . Depression   . Diabetes mellitus without complication ([place])   . Dyspnea   . GERD (gastroesophageal reflux disease)   . Headache   . Hypertension   . Pneumonia      Past Surgical History:  Procedure Laterality Date  . CARDIAC CATHETERIZATION    . COLONOSCOPY WITH PROPOFOL N/A 01/17/2021   Procedure: COLONOSCOPY WITH PROPOFOL;  Surgeon: Lin Landsman, MD;  Location: [facility] ENDOSCOPY;  Service: Gastroenterology;  Laterality: N/A;  Pending C-19 test from 2/21 [facility] INTERPRETER  . COLONOSCOPY WITH PROPOFOL N/A 01/18/2021   Procedure: COLONOSCOPY WITH PROPOFOL;  Surgeon: Lin Landsman, MD;  Location: [facility] ENDOSCOPY;  Service: Gastroenterology;  Laterality: N/A;  . ESOPHAGOGASTRODUODENOSCOPY (EGD) WITH PROPOFOL N/A 01/17/2021   Procedure:  ESOPHAGOGASTRODUODENOSCOPY (EGD) WITH PROPOFOL;  Surgeon: Lin Landsman, MD;  Location: [facility] ENDOSCOPY;  Service: Gastroenterology;  Laterality: N/A;  . LEFT HEART CATH AND CORONARY ANGIOGRAPHY N/A 11/05/2018    Procedure: LEFT HEART CATH AND CORONARY ANGIOGRAPHY;  Surgeon: Yolonda Kida, MD;  Location: [facility];  Service: Cardiovascular;  Laterality: N/A;    Social History   Socioeconomic History  . Marital status: Legally Separated    Spouse name: Not on file  . Number of children: Not on file  . Years of education: Not on file  . Highest education level: Not on file  Occupational History  . Not on file  Tobacco Use  . Smoking status: Former Research scientist (life sciences)  . Smokeless tobacco: Never Used  Vaping Use  . Vaping Use: Never used  Substance and Sexual Activity  . Alcohol use: Not Currently    Comment: quit one month ago  . Drug use: Never  . Sexual activity: Not on file  Other Topics Concern  . Not on file  Social History Narrative  . Not on file   Social Determinants of Health   Financial Resource Strain: Not on file  Food Insecurity: Not on file  Transportation Needs: Not on file  Physical Activity: Not on file  Stress: Not on file  Social Connections: Not on file  Intimate Partner Violence: Not on file     No family history on file.   Current Outpatient Medications:  .  amLODipine (NORVASC) 5 MG tablet, Take by mouth., Disp: , Rfl:  .  clarithromycin (BIAXIN) 500 MG tablet, Take 1 tablet (500 mg total) by mouth 2 (two) times daily for 14 days., Disp: 28 tablet, Rfl: 0 .  FLOMAX 0.4 MG CAPS capsule, Take 0.4 mg by mouth at bedtime., Disp: , Rfl:  .  lisinopril-hydrochlorothiazide (ZESTORETIC) 20-12.5 MG tablet, Take 1 tablet by mouth daily., Disp: , Rfl:  .  LORazepam (ATIVAN) 1 MG tablet, Take 1 mg by mouth every 8 (eight) hours., Disp: , Rfl:  .  metroNIDAZOLE (FLAGYL) 500 MG tablet, Take 1 tablet (500 mg total) by mouth 3 (three) times daily for 14 days., Disp: 42 tablet, Rfl: 0 .  omeprazole (PRILOSEC) 20 MG capsule, Take 1 capsule (20 mg total) by mouth 2 (two) times daily before a meal for 14 days., Disp: 28 capsule, Rfl: 0 .  pantoprazole (PROTONIX) 40 MG  tablet, Take 40 mg by mouth daily., Disp: , Rfl:    Physical exam:  Vitals:   01/19/21 1516  BP: 131/67  Pulse: (!) 56  Resp: 18  Temp: 98.1 F (36.7 C)  TempSrc: Tympanic  SpO2: 100%  Weight: 244 lb 4.8 oz (110.8 kg)   Physical Exam Constitutional:      Appearance: He is obese.  Eyes:     Extraocular Movements: EOM normal.  Cardiovascular:     Rate and Rhythm: Normal rate and regular rhythm.     Heart sounds: Normal heart sounds.  Pulmonary:     Effort: Pulmonary effort is normal.     Breath sounds: Normal breath sounds.  Abdominal:     General: Bowel sounds are normal.     Palpations: Abdomen is soft.  Skin:    General: Skin is warm and dry.  Neurological:     Mental Status: He is alert and oriented to person, place, and time.        CMP Latest Ref Rng & Units 01/02/2021  Glucose 65 - 99 mg/dL 87  BUN  8 - 27 mg/dL 15  Creatinine 0.76 - 1.27 mg/dL 1.12  Sodium 134 - 144 mmol/L 140  Potassium 3.5 - 5.2 mmol/L 5.1  Chloride 96 - 106 mmol/L 104  CO2 20 - 29 mmol/L 22  Calcium 8.6 - 10.2 mg/dL 9.0  Total Protein 6.0 - 8.5 g/dL 7.4  Total Bilirubin 0.0 - 1.2 mg/dL 0.3  Alkaline Phos 44 - 121 IU/L 102  AST 0 - 40 IU/L 12  ALT 0 - 44 IU/L 11   CBC Latest Ref Rng & Units 01/02/2021  WBC 3.4 - 10.8 x10E3/uL 7.1  Hemoglobin 13.0 - 17.7 g/dL 10.1(L)  Hematocrit 37.5 - 51.0 % 34.4(L)  Platelets 150 - 450 x10E3/uL 381    Assessment and plan- Patient is a 70 y.o. male referred for iron deficiency anemia  Iron deficiency anemia: Patient could not complete his colonoscopy due to tortuous colon as well as copious amounts of stool in the colon and would likely need a repeat procedure in the future.  Therefore etiology of his iron deficiency anemia is presently unclear.  EGD was otherwise unremarkable.  Patient does have moderate anemia with a hemoglobin of 10.1 and evidence of iron deficiency based on his recent labs.  He would therefore benefit from IV iron.  I would  recommend 2 doses of Feraheme 510 mg IV weekly.  Discussed risks and benefits of Feraheme including all but not limited to headache, leg swelling and possible risk of infusion reaction.  Patient is a resident of [place] and needs to be there by this month and and will likely come back to [place] after 6 to 8 weeks.  He therefore cannot receive IV iron at this time and will continue to take oral iron.  Patient also has evidence of B12 deficiency and was asked to start B12 supplements by Dr. Marius Ditch.  Repeat CBC ferritin and iron studies in 2 months and I will see him thereafter.  If he continues to have anemia on oral iron at that time I will give him IV iron  Thank you for this kind referral and the opportunity to participate in the care of this patient   Visit Diagnosis 1. Iron deficiency anemia, unspecified iron deficiency anemia type   2. B12 deficiency     Dr. Randa Evens, MD, MPH [facility] at [facility] [phone] 01/19/2021 3:48 PM

## 2021-01-24 ENCOUNTER — Ambulatory Visit: Payer: Medicare Other

## 2021-01-31 ENCOUNTER — Ambulatory Visit: Payer: Medicare Other

## 2021-02-07 ENCOUNTER — Inpatient Hospital Stay: Admit: 2021-02-07 | Payer: Medicare Other

## 2021-03-19 ENCOUNTER — Other Ambulatory Visit: Payer: Self-pay | Admitting: *Deleted

## 2021-03-19 DIAGNOSIS — D509 Iron deficiency anemia, unspecified: Secondary | ICD-10-CM

## 2021-03-19 DIAGNOSIS — E538 Deficiency of other specified B group vitamins: Secondary | ICD-10-CM

## 2021-03-20 ENCOUNTER — Inpatient Hospital Stay: Payer: Medicare Other | Attending: Oncology

## 2021-03-20 ENCOUNTER — Inpatient Hospital Stay: Payer: Medicare Other | Admitting: Oncology

## 2021-03-20 ENCOUNTER — Telehealth: Payer: Self-pay | Admitting: Oncology

## 2021-03-20 NOTE — Telephone Encounter (Signed)
Left VM with patient to reschedule today's missed appt. 

## 2021-03-28 ENCOUNTER — Encounter: Payer: Self-pay | Admitting: Gastroenterology

## 2021-03-28 ENCOUNTER — Ambulatory Visit: Payer: Medicare Other | Admitting: Gastroenterology
